# Patient Record
Sex: Female | Born: 1960 | Race: White | Hispanic: No | Smoking: Never smoker
Health system: Southern US, Community
[De-identification: ages and names within clinical notes are randomized; demographics above are authoritative.]

## PROBLEM LIST (undated history)

## (undated) DIAGNOSIS — K219 Gastro-esophageal reflux disease without esophagitis: Secondary | ICD-10-CM

## (undated) DIAGNOSIS — Z9109 Other allergy status, other than to drugs and biological substances: Secondary | ICD-10-CM

## (undated) DIAGNOSIS — J45909 Unspecified asthma, uncomplicated: Secondary | ICD-10-CM

## (undated) DIAGNOSIS — I10 Essential (primary) hypertension: Secondary | ICD-10-CM

## (undated) DIAGNOSIS — J40 Bronchitis, not specified as acute or chronic: Secondary | ICD-10-CM

## (undated) DIAGNOSIS — M810 Age-related osteoporosis without current pathological fracture: Secondary | ICD-10-CM

## (undated) DIAGNOSIS — F418 Other specified anxiety disorders: Secondary | ICD-10-CM

## (undated) DIAGNOSIS — F419 Anxiety disorder, unspecified: Secondary | ICD-10-CM

## (undated) DIAGNOSIS — Z9289 Personal history of other medical treatment: Secondary | ICD-10-CM

## (undated) DIAGNOSIS — Z8669 Personal history of other diseases of the nervous system and sense organs: Secondary | ICD-10-CM

## (undated) DIAGNOSIS — J302 Other seasonal allergic rhinitis: Secondary | ICD-10-CM

## (undated) HISTORY — DX: Personal history of other diseases of the nervous system and sense organs: Z86.69

## (undated) HISTORY — PX: ABDOMINAL HYSTERECTOMY: SHX81

## (undated) HISTORY — DX: Gastro-esophageal reflux disease without esophagitis: K21.9

## (undated) HISTORY — PX: TOTAL HIP ARTHROPLASTY: SHX124

## (undated) HISTORY — DX: Other specified anxiety disorders: F41.8

## (undated) HISTORY — DX: Personal history of other medical treatment: Z92.89

## (undated) HISTORY — PX: ROTATOR CUFF REPAIR: SHX139

## (undated) HISTORY — DX: Unspecified asthma, uncomplicated: J45.909

## (undated) HISTORY — DX: Other allergy status, other than to drugs and biological substances: Z91.09

## (undated) HISTORY — PX: WRIST SURGERY: SHX841

---

## 1998-03-02 ENCOUNTER — Other Ambulatory Visit: Admission: RE | Admit: 1998-03-02 | Discharge: 1998-03-02 | Payer: Self-pay | Admitting: Obstetrics and Gynecology

## 1999-04-08 ENCOUNTER — Other Ambulatory Visit: Admission: RE | Admit: 1999-04-08 | Discharge: 1999-04-08 | Payer: Self-pay | Admitting: Obstetrics and Gynecology

## 1999-07-04 ENCOUNTER — Inpatient Hospital Stay (HOSPITAL_COMMUNITY): Admission: RE | Admit: 1999-07-04 | Discharge: 1999-07-05 | Payer: Self-pay | Admitting: Obstetrics and Gynecology

## 2000-05-01 ENCOUNTER — Other Ambulatory Visit: Admission: RE | Admit: 2000-05-01 | Discharge: 2000-05-01 | Payer: Self-pay | Admitting: Obstetrics and Gynecology

## 2001-05-14 ENCOUNTER — Other Ambulatory Visit: Admission: RE | Admit: 2001-05-14 | Discharge: 2001-05-14 | Payer: Self-pay | Admitting: Obstetrics and Gynecology

## 2002-06-28 ENCOUNTER — Other Ambulatory Visit: Admission: RE | Admit: 2002-06-28 | Discharge: 2002-06-28 | Payer: Self-pay | Admitting: Obstetrics and Gynecology

## 2003-07-10 ENCOUNTER — Other Ambulatory Visit: Admission: RE | Admit: 2003-07-10 | Discharge: 2003-07-10 | Payer: Self-pay | Admitting: Obstetrics and Gynecology

## 2003-09-30 ENCOUNTER — Inpatient Hospital Stay (HOSPITAL_COMMUNITY): Admission: EM | Admit: 2003-09-30 | Discharge: 2003-10-01 | Payer: Self-pay | Admitting: Emergency Medicine

## 2004-01-30 ENCOUNTER — Ambulatory Visit (HOSPITAL_COMMUNITY): Admission: RE | Admit: 2004-01-30 | Discharge: 2004-01-31 | Payer: Self-pay | Admitting: Orthopedic Surgery

## 2004-09-24 ENCOUNTER — Other Ambulatory Visit: Admission: RE | Admit: 2004-09-24 | Discharge: 2004-09-24 | Payer: Self-pay | Admitting: Obstetrics and Gynecology

## 2005-10-27 ENCOUNTER — Other Ambulatory Visit: Admission: RE | Admit: 2005-10-27 | Discharge: 2005-10-27 | Payer: Self-pay | Admitting: Obstetrics and Gynecology

## 2007-03-09 ENCOUNTER — Ambulatory Visit: Payer: Self-pay | Admitting: Internal Medicine

## 2008-05-08 ENCOUNTER — Encounter: Admission: RE | Admit: 2008-05-08 | Discharge: 2008-05-08 | Payer: Self-pay | Admitting: Internal Medicine

## 2008-11-04 ENCOUNTER — Encounter: Admission: RE | Admit: 2008-11-04 | Discharge: 2008-11-04 | Payer: Self-pay | Admitting: Orthopedic Surgery

## 2010-08-16 ENCOUNTER — Encounter
Admission: RE | Admit: 2010-08-16 | Discharge: 2010-08-16 | Payer: Self-pay | Source: Home / Self Care | Attending: Allergy | Admitting: Allergy

## 2010-10-02 ENCOUNTER — Other Ambulatory Visit: Payer: Self-pay | Admitting: Obstetrics and Gynecology

## 2010-10-04 ENCOUNTER — Other Ambulatory Visit: Payer: Self-pay | Admitting: Obstetrics and Gynecology

## 2010-10-04 DIAGNOSIS — N6459 Other signs and symptoms in breast: Secondary | ICD-10-CM

## 2010-11-25 ENCOUNTER — Ambulatory Visit
Admission: RE | Admit: 2010-11-25 | Discharge: 2010-11-25 | Disposition: A | Discharge status: Home / Self Care | Payer: Self-pay | Source: Ambulatory Visit | Attending: Obstetrics and Gynecology | Admitting: Obstetrics and Gynecology

## 2010-11-25 DIAGNOSIS — N6459 Other signs and symptoms in breast: Secondary | ICD-10-CM

## 2011-01-21 NOTE — Assessment & Plan Note (Signed)
[facility]                           PRIMARY CARE OFFICE NOTE   MENDE, BISWELL                      MRN:          [id number]  DATE:03/09/2007                            DOB:          Jun 10, 1961    REFERRING PHYSICIAN:  Guy Sandifer. Henderson Cloud, M.D.   CHIEF COMPLAINT:  New patient to practice/high blood pressure.   HISTORY OF PRESENT ILLNESS:  Patient is a 50 year old white female here  to establish primary care.  She has seen her gynecologist, and he has  noted several elevated blood pressure readings within the last 3-6  months.  At home, she notes systolic blood pressure of a high 140s and  diastolic high 90s to low 100s.  She occasionally has associated  headache, however unclear whether her headache is from elevated blood  pressure versus history of chronic migraines, for which she sees Dr.  Meryl Crutch.   Review of her recent history notes chronic NSAIDs use for neck pain.  She underwent evaluation by a physician at [facility].  An MRI was performed.  She was recently prescribed a Medrol  Dosepak which has somewhat improved her symptoms; however, she notes a  chronic history of taking Advil for her headaches, and for the last two  months, she has been taking at least 600 mg of ibuprofen daily.  She  also takes an allergy medication with a decongestant every 12 hours.   She did have preeclampsia with her first child, but the blood pressure  stabilized two years after pregnancy.  She was on an antihypertensive in  the past.  She denies any history of heart disease or a stroke.  She  does note a family history of hypertension in both her father and  mother.  No family history of premature coronary disease.  She has had  several orthopedic procedures in the past.  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  She has also had  right  rotator cuff repair and also right wrist surgery, and a hysterectomy in  1996.  She also has anxiety issues.  Due to increased stress and the  death of her mother, she was started on Xanax ER and has been on  benzodiazepines for the last 10 years.   PAST MEDICAL HISTORY:  1. History of migraine headache.  2. Gastroesophageal reflux disease.  3. History of preeclampsia.  4. 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  5. Right wrist surgery in 2005.  6. Right rotator cuff repair in 2005.  7. Status post hysterectomy in 1996.  8. Chronic allergic rhinitis.   CURRENT MEDICATIONS:  1. Allergy/D caplets q.12h.  2. Xanax ER 1 mg once daily.  3. Wellbutrin XL 300 mg once daily.  4. Ambien 10 mg at bedtime.  5. Topamax 100 mg at bedtime.  6. Flexeril 10 mg at bedtime.   ALLERGIES TO MEDICATIONS:  PENICILLIN, which causes a rash.  Also,  DEMEROL, MORPHINE, and CODEINE cause severe pruritus.  She notes an  intolerance to TETRACYCLINE and SEPTRA.   SOCIAL HISTORY:  Patient is married for the last 15 years.  She has one  teenaged son who is 35 years old who accompanies her today.   FAMILY HISTORY:  Father is alive at age 33.  History of colon cancer,  status post surgery and chemo.  Hypertensive.  Mother deceased at age 85  due to complications of rare lung disease. She was hypertensive and had  migraine headaches.   HABITS:  Occasional alcohol.  No history of tobacco use or recreational  drug use.   PREVENTATIVE CARE HISTORY:  Last Pap was in 2008.  Last colonoscopy was  four years ago with Dr. Matthias Hughs.   REVIEW OF SYSTEMS:  No current HEENT symptoms.  Her migraine headaches  have been relatively quiescent.  Denies any chest pain or dyspnea.  No  current heartburn, nausea, vomiting, constipation, diarrhea.  All other  systems are negative.   PHYSICAL EXAMINATION:  VITAL SIGNS:  Height is 5 feet 4.  Weight is  138.8 pounds.  Temperature is 97.  Pulse is 102.  BP is  148/105 in the  left arm in a seated position.  GENERAL:  The patient is a pleasant, well-developed and well-nourished  50 year old white female who appears her stated age.  HEENT:  Normocephalic, atraumatic.  Pupils are equal and reactive to  light bilaterally.  Extraocular motility was intact.  Patient was  anicteric.  Conjunctivae were within normal limits.  External auditory  canals and tympanic membranes are clear bilaterally.  Oropharyngeal exam  is unremarkable.  NECK:  Supple without any evidence of adenopathy, carotid bruits, or  thyromegaly.  LUNGS:  Normal respiratory effort.  Chest is clear to auscultation  bilaterally.  No rales, rhonchi or wheezing.  CARDIOVASCULAR:  Regular rate and rhythm.  No significant murmurs, rubs  or gallops appreciated.  ABDOMEN:  Soft and nontender.  Positive bowel sounds.  No organomegaly.  MUSCULOSKELETAL:  Patient had a healed scar of the right shoulder.  NEUROLOGIC:  Cranial nerves II-XII are grossly intact.  She was  nonfocal.   IMPRESSION:  1. Hypertension, likely exacerbated by nonsteroidals use and      decongestants.  2. Migraine headache.  3. Gastroesophageal reflux disease.  4. History of generalized anxiety disorder, on chronic      benzodiazepines.  5. History of left hip replacement.  6. Health maintenance.   RECOMMENDATIONS:  1. Patient will be started on Inderal LA 80 mg once daily.  2. She is to decrease her nonsteroidal use and also taper off      decongestant use.  3. She was given a prescription for Xizal 5 mg p.o. nightly.  She has      had poor response to over-the-counter Claritin and Zyrtec in the      past.  4. We will arrange screening labs and baseline kidney and liver      function.  We discussed the risks of nephrotoxicity with chronic      NSAIDs use.  5. Follow up in 4-6 weeks.     Barbette Hair. Artist Pais, DO  Electronically Signed    RDY/MedQ  DD: 03/09/2007  DT: 03/09/2007  Job #: [id number]

## 2011-01-24 NOTE — Op Note (Signed)
Cynthia Gordon, Cynthia Gordon                    ACCOUNT NO.:  [id number]   MEDICAL RECORD NO.:  [id number]                   PATIENT TYPE:  INP   LOCATION:  4034                                 FACILITY:  [facility]   PHYSICIAN:  Burnard Bunting, M.D.                 DATE OF BIRTH:  [birth year]   DATE OF PROCEDURE:  09/30/2003  DATE OF DISCHARGE:  10/01/2003                                 OPERATIVE REPORT   PREOPERATIVE DIAGNOSIS:  Right distal radius fracture.   POSTOPERATIVE DIAGNOSIS:  Right distal radius fracture.   PROCEDURE:  Open reduction and internal fixation of right distal radius  fracture.   SURGEON:  Burnard Bunting, M.D.   ASSISTANT:  Jerolyn Shin. Tresa Res, M.D.   ANESTHESIA:  General endotracheal.   ESTIMATED BLOOD LOSS:  20 mL   DRAINS:  None.   TOURNIQUET TIME:  Approximately 40 minutes at 250 mmHg.   DESCRIPTION OF PROCEDURE:  The patient was brought to the operating room and  general endotracheal anesthesia was induced, preoperative antibiotics were  administered.  The right hand, wrist and arm were prepped with Duraprep  solution and draped in a sterile manner.  Collier Flowers was used to cover the  operative field. Fracture reduction was performed. The arm was then covered  with Ioban.  The arm was elevated and exsanguinated with the esmarch wrap,  tourniquet was inflated.  A volar approach to the distal radius was made  over the FCR tendon from the proximal wrist flexion crease 8 cm proximally.  The skin and subcutaneous tissue were sharply divided, bleeding points  encountered were controlled using bipolar electrocautery.  The FCR sheath  was incised on its volar surface. The FCR tendon was mobilized radially and  the sheath was again incised on its ventral surface.  Blunt dissection with  the FCR tendon and radial artery protected, the blunt dissection was  performed along the pronator quadratus. The median nerve and flexor tendons  were retracted ulnarly.  The pronator  quadratus was then detached from the  radial aspect of the radial shaft.  The fracture site was visualized. The  distal fragment was also well visualized.  A DVR volar plate was then  applied and secured in the adjustable hole with one screw. The fracture was  then reduced to the plate under fluoroscopic guidance in the AP and lateral  planes with good reduction achieved. While maintaining traction, multiple  locking screws were placed in two planes for fracture fixation.  Subarticular placement of the screws was confirmed in the AP and lateral  planes under fluoroscopy.  At this time, the proximal holes were filled with  3.5 bicortical screws.  Screw length was checked in the AP and lateral  planes under fluoroscopy.  This time the tourniquet was released, bleeding  points encountered were controlled using electrocautery. The skin was then  closed using interrupted inverted 3-0 Vicryl suture followed by simple 3-0  nylon  sutures.  A bulky dressing and volar splint were applied. The patient's hand  was then placed in a blue cradle boot.  Total tourniquet time was  approximately 40 minutes. The patient tolerated the procedure well without  immediate complications.                                               Burnard Bunting, M.D.    GSD/MEDQ  D:  10/03/2003  T:  10/03/2003  Job:  [id number]

## 2011-01-24 NOTE — H&P (Signed)
NAMEMARICEL, SWARTZENDRUBER                    ACCOUNT NO.:  [id number]   MEDICAL RECORD NO.:  [id number]                   PATIENT TYPE:  INP   LOCATION:  1610                                 FACILITY:  [facility]   PHYSICIAN:  Burnard Bunting, M.D.                 DATE OF BIRTH:  [birth year]   DATE OF ADMISSION:  09/30/2003  DATE OF DISCHARGE:                                HISTORY & PHYSICAL   CHIEF COMPLAINT:  Right wrist pain.   HISTORY OF PRESENT ILLNESS:  Cynthia Gordon is a 50 year old, left-hand  dominant female who fell on the ice on her outstretched hand on September 30, 2003.  She denies any elbow or shoulder pain.   PAST MEDICAL HISTORY:  Her past medical history is notable for:  1. Stress fracture on the left, status post total hip arthroplasty     approximately five years ago at [facility].  2. __________ grafting approximately ten years ago.  3. Hysterectomy in 1996.  4. Bladder surgery in 1998 and 2000.   DRUG ALLERGIES:  1. PENICILLIN.  2. MORPHINE.  3. ERYTHROMYCIN.  4. TETRACYCLINE.  5. CODEINE.   Give itching.  She has had no anaphylactic-type reactions.   CURRENT MEDICATIONS:  Zoloft, Xanax, Clarinex, Fosamax, vitamins, Trazodone,  and Topamax.   PHYSICAL EXAMINATION:  GENERAL APPEARANCE:  The patient is in mild distress.  CHEST:  Clear to auscultation.  HEART:  The heartbeat is regular.  ABDOMEN:  Benign.  EXTREMITIES:  The right wrist demonstrated mild deformity and swelling.  Sensation is intact in the medial radial and ulnar distribution.  She has  good EPL and interosseous function, although strength is somewhat diminished  due to pain.   LABORATORY DATA:  X-rays show intra-articular dorsally displaced fracture.   IMPRESSION:  Right distal radius fracture.   PLAN:  Open reduction and internal fixation.  The risks and benefits were  discussed.  The primary risks including, but not limited to infection, nerve  and vessel damage, nonunion,  malunion, and need for more surgery, are  discussed with the patient.  The patient and her husband understand.  All  questions answered.                                               Burnard Bunting, M.D.    GSD/MEDQ  D:  10/01/2003  T:  10/01/2003  Job:  [id number]

## 2011-01-24 NOTE — Op Note (Signed)
NAME:  Cynthia Gordon, Cynthia Gordon                         ACCOUNT NO.:  [id number]   MEDICAL RECORD NO.:  [id number]                   PATIENT TYPE:  OIB   LOCATION:  4733                                 FACILITY:  [facility]   PHYSICIAN:  Burnard Bunting, M.D.                 DATE OF BIRTH:  08/06/1961   DATE OF PROCEDURE:  DATE OF DISCHARGE:  01/31/2004                                 OPERATIVE REPORT   PREOPERATIVE DIAGNOSIS:  Right shoulder rotator cuff tear and labral tear.   POSTOPERATIVE DIAGNOSIS:  Right shoulder rotator cuff tear and labral tear.   OPERATION PERFORMED:  Right shoulder diagnostic arthroscopy with debridement  of labral tear, open biceps tenodesis and open rotator cuff repair.   SURGEON:  Burnard Bunting, M.D.   ASSISTANT:  Jerolyn Shin. Lavender, M.D.   ESTIMATED BLOOD LOSS:  25 mL.   DRAINS:  None.   EXAMINATION UNDER ANESTHESIA:  Range of motion:  External rotation in 15  degrees, abduction 70 degrees, external rotation 90 degrees, abduction 90,  internal rotation 90 degrees, abduction 60.  Patient has about 175 degrees  forward flexion.  She has 1+ anterior posterior instability with less than 1  cm sulcus signs.   OPERATIVE FINDINGS:  Diagnostic and operative arthroscopy.  1. Type 2 SLAP tear.  2. No Hill-Sachs lesions or bony Bankart lesion.  3. Intact articular cartilage.  4. 95% thickness rotator cuff tear at the leading edge of the supraspinatus.  5. Stretching of the labral tissue off the anterior inferior glenohumeral     ligament.   DESCRIPTION OF PROCEDURE:  The patient was brought to the operating room  where general endotracheal anesthesia was induced.  Preoperative IV  antibiotics were administered.  Patient was positioned in beach chair  positioner with the head in neutral position.  Right shoulder, arm and hand  were prepped in DuraPrep solution and draped in sterile manner.  Topographic  anatomy of the shoulder was identified including the anterior,  posterior and  lateral margins of the acromion as well as the coracoid process.  The  subacromial space was injected with 100,000 to 200,000 mL of epinephrine and  saline.  The shoulder joint was injected with 20 mL of saline.  The portal  for the scope was placed 2 cm  medial and inferior to the posterolateral  margin of the acromion.  The anterior portal was created under direct  visualization with a spinal needle.  Diagnostic and operative arthroscopy  was performed. Type 2 SLAP was identified.  The biceps anchor was unstable.  The anterior inferior glenohumeral ligament appeared stretched but was  structurally intact.  There was no Hill-Sachs lesion, rotator cuff was noted  to have 95% thickness tear at the leading edge of the supraspinatus.  This  was visualized with both the bursal and articular side.  At this time the  biceps tendon was released.  Labrum was debrided.  Very early synovitis was  noted on the rotator cuff, particularly the infraspinatus.  At this time the  subacromial decompression and bursectomy was performed.  This made the 95%  thickness rotator cuff tear quite visible.  Following release but not  resection of the coracoacromial ligament through a lateral portal, the  instruments were removed.  The anterior and posterior portals were closed  using 3-0 nylon suture.  Collier Flowers was used to cover the operative field.  A 3  cm incision was then made off the anterolateral margin of the acromion.  Skin and subcutaneous tissue were sharply divided. Deltoid was split for a  measured distance of 3 cm.  Rotator cuff tear was identified.  Through this  incision, biceps tenodesis was performed with bioabsorbable screw with a 7 x  22 mm bioabsorbable interference screw and a #2 FiberWire suture.  Rotator  cuff was repaired using one 5.0 bioabsorbable corkscrew anchor.  The  watertight repair was achieved after a thorough prepping of the greater  tuberosity.  The shoulder was then  irrigated.  Deltoid split was closed  using a #1 Vicryl suture.  The skin was closed using interrupted inverted 3-  0 Prolene.  The patient was then placed in a bulky dressing and shoulder  immobilizer.  The patient tolerated the procedure well without immediate  complications.                                               Burnard Bunting, M.D.    GSD/MEDQ  D:  02/11/2004  T:  02/12/2004  Job:  [id number]

## 2011-10-30 ENCOUNTER — Other Ambulatory Visit: Payer: Self-pay | Admitting: Obstetrics and Gynecology

## 2011-10-30 DIAGNOSIS — Z1231 Encounter for screening mammogram for malignant neoplasm of breast: Secondary | ICD-10-CM

## 2011-11-27 ENCOUNTER — Ambulatory Visit: Payer: Self-pay

## 2011-12-05 ENCOUNTER — Ambulatory Visit: Payer: Self-pay

## 2011-12-23 ENCOUNTER — Ambulatory Visit
Admission: RE | Admit: 2011-12-23 | Discharge: 2011-12-23 | Disposition: A | Discharge status: Home / Self Care | Payer: Federal, State, Local not specified - PPO | Source: Ambulatory Visit | Attending: Obstetrics and Gynecology | Admitting: Obstetrics and Gynecology

## 2011-12-23 DIAGNOSIS — Z1231 Encounter for screening mammogram for malignant neoplasm of breast: Secondary | ICD-10-CM

## 2012-03-06 ENCOUNTER — Ambulatory Visit (INDEPENDENT_AMBULATORY_CARE_PROVIDER_SITE_OTHER): Payer: Federal, State, Local not specified - PPO | Admitting: Physician Assistant

## 2012-03-06 VITALS — BP 130/87 | HR 86 | Temp 98.7°F | Resp 16 | Ht 63.5 in | Wt 151.8 lb

## 2012-03-06 DIAGNOSIS — R059 Cough, unspecified: Secondary | ICD-10-CM

## 2012-03-06 DIAGNOSIS — J329 Chronic sinusitis, unspecified: Secondary | ICD-10-CM

## 2012-03-06 DIAGNOSIS — R05 Cough: Secondary | ICD-10-CM

## 2012-03-06 MED ORDER — PREDNISONE 20 MG PO TABS: ORAL_TABLET | ORAL | Status: AC

## 2012-03-06 MED ORDER — AZITHROMYCIN 250 MG PO TABS: ORAL_TABLET | ORAL | Status: AC

## 2012-03-06 MED ORDER — HYDROCODONE-HOMATROPINE 5-1.5 MG/5ML PO SYRP: 5.0000 mL | ORAL_SOLUTION | Freq: Three times a day (TID) | ORAL | Status: AC | PRN

## 2012-03-06 NOTE — Progress Notes (Signed)
  Subjective:    Patient ID: Cynthia Gordon, female    DOB: 1960-11-25, 51 y.o.   MRN: [id number]  HPI Patient presents with 7 day history of nasal congestion, cough, sinus pressure, and ear fullness. Denies fever, chills, nausea, vomiting, or diarrhea. She takes Claritin and Singulair daily for seasonal allergies. Also has been taking Mucinex which helps slightly.    Admits to increased stress at home. Husband recently diagnosed with Alzheimer's. 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        Review of Systems  Constitutional: Negative for fever and chills.  HENT: Positive for congestion, sore throat, rhinorrhea, postnasal drip and sinus pressure. Negative for ear pain.   Eyes: Negative for discharge and itching.  Respiratory: Positive for cough. Negative for wheezing.   Gastrointestinal: Negative for nausea, vomiting and diarrhea.  Skin: Negative for rash.  Neurological: Negative for dizziness and headaches.       Objective:   Physical Exam  Constitutional: She is oriented to person, place, and time. She appears well-developed and well-nourished.  HENT:  Head: Normocephalic and atraumatic.  Right Ear: Hearing, tympanic membrane, external ear and ear canal normal.  Left Ear: Hearing, tympanic membrane, external ear and ear canal normal.  Mouth/Throat: Uvula is midline, oropharynx is clear and moist and mucous membranes are normal. No oropharyngeal exudate.       Slight tonsillar erythema bilaterally. No exudate or tonsillar swelling  Eyes: Conjunctivae are normal.  Neck: Normal range of motion. Neck supple.  Cardiovascular: Normal rate, regular rhythm and normal heart sounds.   Pulmonary/Chest: Effort normal and breath sounds normal.  Lymphadenopathy:    She has no cervical adenopathy.  Neurological: She is alert and oriented to person, place, and time.  Psychiatric: She has a normal mood and affect. Her behavior is normal. Judgment and thought content normal.           Assessment & Plan:   1. Sinusitis  Continue Claritin and Mucinex daily  Use Astepro nasal spray as directed azithromycin (ZITHROMAX) 250 MG tablet, predniSONE (DELTASONE) 20 MG tablet  2. Cough  Per patient she tolerates hydrocodone but not codeine.  HYDROcodone-homatropine (HYCODAN) 5-1.5 MG/5ML syrup

## 2012-03-23 ENCOUNTER — Ambulatory Visit (INDEPENDENT_AMBULATORY_CARE_PROVIDER_SITE_OTHER): Payer: Federal, State, Local not specified - PPO | Admitting: Physician Assistant

## 2012-03-23 VITALS — BP 128/78 | HR 94 | Temp 98.6°F | Resp 17 | Ht 63.0 in | Wt 152.0 lb

## 2012-03-23 DIAGNOSIS — J309 Allergic rhinitis, unspecified: Secondary | ICD-10-CM

## 2012-03-23 DIAGNOSIS — J029 Acute pharyngitis, unspecified: Secondary | ICD-10-CM

## 2012-03-23 DIAGNOSIS — J329 Chronic sinusitis, unspecified: Secondary | ICD-10-CM

## 2012-03-23 LAB — POCT RAPID STREP A (OFFICE): Rapid Strep A Screen: NEGATIVE

## 2012-03-23 MED ORDER — IPRATROPIUM BROMIDE 0.03 % NA SOLN: 2.0000 | Freq: Two times a day (BID) | NASAL | Status: DC

## 2012-03-23 MED ORDER — LEVOFLOXACIN 500 MG PO TABS: 500.0000 mg | ORAL_TABLET | Freq: Every day | ORAL | Status: AC

## 2012-03-23 NOTE — Progress Notes (Signed)
  Subjective:    Patient ID: Cynthia Gordon, female    DOB: 08/07/1961, 51 y.o.   MRN: [id number]  HPI 51 year old female presents with congestion, bilateral otalgia, and sore throat. Was seen on 6/29 and given a Z-pack, hycodan, and prednisone. Says these helped but then last week her symptoms returned.  Complains of ear pressure and fullness as well as sore throat and nasal congestion. Currently takes Claritin and Singulair daily as well as uses Nasocort.  She has an allergist who she has seen in the past but has never been on allergy injections.   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  Admits to increased fatigue.     Review of Systems  All other systems reviewed and are negative.       Objective:   Physical Exam  Constitutional: She is oriented to person, place, and time. She appears well-developed and well-nourished.  HENT:  Head: Normocephalic and atraumatic.  Right Ear: Hearing, external ear and ear canal normal. A middle ear effusion is present.  Left Ear: Hearing, external ear and ear canal normal. A middle ear effusion is present.  Mouth/Throat: Uvula is midline and mucous membranes are normal. No oropharyngeal exudate (+tonsilar erythema).  Eyes: Conjunctivae are normal.  Neck: Normal range of motion. Neck supple.  Cardiovascular: Normal rate, regular rhythm and normal heart sounds.   Pulmonary/Chest: Effort normal and breath sounds normal.  Lymphadenopathy:    She has no cervical adenopathy.  Neurological: She is alert and oriented to person, place, and time.  Psychiatric: She has a normal mood and affect. Her behavior is normal. Judgment and thought content normal.          Assessment & Plan:   1. Acute pharyngitis  POCT rapid strep A  2. Sinusitis  levofloxacin (LEVAQUIN) 500 MG tablet  3. Allergic rhinitis  ipratropium (ATROVENT) 0.03 % nasal spray  If no improvement of symptoms after  this course of antibiotics, recommend follow up with either her allergist or refer to ENT.  Recommend follow up with PCP to discuss options for adjusting anti-depressant's while dealing with this time of stress.

## 2012-04-02 ENCOUNTER — Other Ambulatory Visit: Payer: Self-pay | Admitting: Allergy

## 2012-04-02 ENCOUNTER — Ambulatory Visit
Admission: RE | Admit: 2012-04-02 | Discharge: 2012-04-02 | Disposition: A | Discharge status: Home / Self Care | Payer: Federal, State, Local not specified - PPO | Source: Ambulatory Visit | Attending: Allergy | Admitting: Allergy

## 2012-04-02 DIAGNOSIS — R059 Cough, unspecified: Secondary | ICD-10-CM

## 2012-04-02 DIAGNOSIS — R05 Cough: Secondary | ICD-10-CM

## 2012-11-26 ENCOUNTER — Other Ambulatory Visit: Payer: Self-pay

## 2012-11-26 DIAGNOSIS — Z1231 Encounter for screening mammogram for malignant neoplasm of breast: Secondary | ICD-10-CM

## 2012-12-24 ENCOUNTER — Ambulatory Visit
Admission: RE | Admit: 2012-12-24 | Discharge: 2012-12-24 | Disposition: A | Discharge status: Home / Self Care | Payer: Federal, State, Local not specified - PPO | Source: Ambulatory Visit

## 2012-12-24 DIAGNOSIS — Z1231 Encounter for screening mammogram for malignant neoplasm of breast: Secondary | ICD-10-CM

## 2013-02-28 ENCOUNTER — Emergency Department (HOSPITAL_COMMUNITY): Payer: Federal, State, Local not specified - PPO

## 2013-02-28 ENCOUNTER — Encounter (HOSPITAL_COMMUNITY): Payer: Self-pay | Admitting: Emergency Medicine

## 2013-02-28 ENCOUNTER — Emergency Department (HOSPITAL_COMMUNITY)
Admission: EM | Admit: 2013-02-28 | Discharge: 2013-03-01 | Disposition: A | Discharge status: Home / Self Care | Payer: Federal, State, Local not specified - PPO | Attending: Emergency Medicine | Admitting: Emergency Medicine

## 2013-02-28 DIAGNOSIS — Z79899 Other long term (current) drug therapy: Secondary | ICD-10-CM | POA: Insufficient documentation

## 2013-02-28 DIAGNOSIS — R079 Chest pain, unspecified: Secondary | ICD-10-CM

## 2013-02-28 DIAGNOSIS — I10 Essential (primary) hypertension: Secondary | ICD-10-CM | POA: Insufficient documentation

## 2013-02-28 HISTORY — DX: Essential (primary) hypertension: I10

## 2013-02-28 LAB — BASIC METABOLIC PANEL
BUN: 20 mg/dL (ref 6–23)
CO2: 29 mEq/L (ref 19–32)
Calcium: 9.9 mg/dL (ref 8.4–10.5)
Chloride: 98 mEq/L (ref 96–112)
Creatinine, Ser: 1.07 mg/dL (ref 0.50–1.10)
GFR calc Af Amer: 68 mL/min — ABNORMAL LOW (ref >90)
GFR calc non Af Amer: 59 mL/min — ABNORMAL LOW (ref >90)
Glucose, Bld: 108 mg/dL — ABNORMAL HIGH (ref 70–99)
Potassium: 3.3 mEq/L — ABNORMAL LOW (ref 3.5–5.1)
Sodium: 137 mEq/L (ref 135–145)

## 2013-02-28 LAB — POCT I-STAT TROPONIN I: Troponin i, poc: 0 ng/mL (ref 0.00–0.08)

## 2013-02-28 LAB — CBC
HCT: 40.3 % (ref 36.0–46.0)
Hemoglobin: 13.7 g/dL (ref 12.0–15.0)
MCH: 30 pg (ref 26.0–34.0)
MCHC: 34 g/dL (ref 30.0–36.0)
MCV: 88.2 fL (ref 78.0–100.0)
Platelets: 312 10*3/uL (ref 150–400)
RBC: 4.57 MIL/uL (ref 3.87–5.11)
RDW: 13.3 % (ref 11.5–15.5)
WBC: 6.9 10*3/uL (ref 4.0–10.5)

## 2013-02-28 LAB — PRO B NATRIURETIC PEPTIDE: Pro B Natriuretic peptide (BNP): 22.2 pg/mL (ref 0–125)

## 2013-02-28 NOTE — ED Provider Notes (Signed)
History    CSN: [id number] Arrival date & time 02/28/13  2157  First MD Initiated Contact with Patient 02/28/13 2352     Chief Complaint  Patient presents with  . Chest Pain   (Consider location/radiation/quality/duration/timing/severity/associated sxs/prior Treatment) HPI Cynthia Gordon is a 52 yo woman with hypertension and GERD.  She presents with complaints of chest pain.   Pain began sometime yesterday. The patient notes that, the day prior, she did not have any food intake because she was preparing a party for her husband. She had several alcoholic drinks that night and attributed discomfort, initially, to acid reflux. Her PCP called a prescription in for Sucralfate and this medication seemed to help, temporarily. But, pain is back.  Pain has been intermittent but present most of the time. She describes it has aching, centrally located. No excacerbating or relieving factors. Pain is non-radiating. She says "just the idea of eating makes me sick". But, she denies nausea and vomiting. No change in bowel movements.  Sx feel like previous GERD sx but says her sx have never been this severe. She has no history of pancreatitis or GB disease. No h/o abdominal surgeries.   Only RF for CAD is HTN. No history of cardiac evaluation.   Current pain is 6/10. Most severe 9/10. Patient notes that BP was 168/96 when paramedics checked it at her home. She said this came down with NTG. She feels like ASA, which she took after speaking with 911 dispatcher, seemed to help her CP somewhat.   Past Medical History  Diagnosis Date  . Hypertension    Past Surgical History  Procedure Laterality Date  . Abdominal hysterectomy    . Total hip arthroplasty    . Rotator cuff repair     No family history on file. History  Substance Use Topics  . Smoking status: Never Smoker   . Smokeless tobacco: Not on file  . Alcohol Use: Yes   OB History   Grav Para Term Preterm Abortions TAB SAB Ect Mult Living               SH: drinks occasionally, no drugs or tobacco use. Paralegal on FMLA. Husband, a general dentist, diagnosed with Alzheimer's at age of 52 yo, 18 months ago. Had to close practice, wife is caregiver.    Review of Systems Gen: no weight loss, fevers, chills, night sweats Eyes: no discharge or drainage, no occular pain or visual changes Nose: no epistaxis or rhinorrhea Mouth: no dental pain, no sore throat Neck: no neck pain Lungs: no SOB, cough, wheezing CV: As per history of present illness, otherwise negative Abd: As per history of present illness, otherwise negative GU: no dysuria or gross hematuria MSK: no myalgias or arthralgias Neuro: no headache, no focal neurologic deficits Skin: no rash Psych- patient notes that she has been under a tremendous amount of psychological stress for the past year.  Allergies  Amoxicillin; Codeine; Demerol; Morphine and related; Septra; and Tetracyclines & related  Home Medications   Current Outpatient Rx  Name  Route  Sig  Dispense  Refill  . buPROPion (WELLBUTRIN XL) 150 MG 24 hr tablet   Oral   Take 150 mg by mouth daily.         . DULoxetine (CYMBALTA) 30 MG capsule   Oral   Take 30 mg by mouth daily.         Marland Kitchen ipratropium (ATROVENT) 0.03 % nasal spray   Nasal   Place 2 sprays  into the nose 2 (two) times daily.   30 mL   5   . ketoprofen (ORUDIS) 75 MG capsule   Oral   Take 75 mg by mouth 4 (four) times daily as needed.         . loratadine (CLARITIN) 10 MG tablet   Oral   Take 10 mg by mouth daily.         . montelukast (SINGULAIR) 10 MG tablet   Oral   Take 10 mg by mouth at bedtime.         . pantoprazole (PROTONIX) 20 MG tablet   Oral   Take 20 mg by mouth daily.         . SUMAtriptan (IMITREX) 100 MG tablet   Oral   Take 100 mg by mouth every 2 (two) hours as needed.         . topiramate (TOPAMAX) 100 MG tablet   Oral   Take 100 mg by mouth 2 (two) times daily.         .  valsartan-hydrochlorothiazide (DIOVAN-HCT) 160-25 MG per tablet   Oral   Take 1 tablet by mouth daily.         Marland Kitchen zolpidem (AMBIEN) 5 MG tablet   Oral   Take 2.5 mg by mouth at bedtime as needed.          BP 122/71  Pulse 89  Temp(Src) 98.1 F (36.7 C) (Oral)  Resp 18  SpO2 97% Physical Exam Gen: well developed and well nourished appearing Head: NCAT Eyes: PERL, EOMI Nose: no epistaixis or rhinorrhea Mouth/throat: mucosa is moist and pink Neck: supple, no stridor Lungs: CTA B, no wheezing, rhonchi or rales Heart-Regular rate and rhythm, 2/6 systolic murmur, extremities well perfused Abd: soft, notender, nondistended Back: no ttp, no cva ttp Skin: no rashese, wnl Neuro: CN ii-xii grossly intact, no focal deficits Psyche; normal affect,  calm and cooperative.   2/6 systolic murmur  ED Course  Procedures (including critical care time)  Dg Chest 2 View  02/28/2013   *RADIOLOGY REPORT*  Clinical Data: Chest pain.  CHEST - 2 VIEW  Comparison: 04/02/2012  Findings: Heart and mediastinal contours are within normal limits. No focal opacities or effusions.  No acute bony abnormality.  IMPRESSION: No active cardiopulmonary disease.   Original Report Authenticated By: Charlett Nose, M.D.   No diagnosis found.  EKG: nsr, no acute ischemic changes, normal intervals, normal axis, normal qrs complex  Results for orders placed during the hospital encounter of 02/28/13 (from the past 24 hour(s))  CBC     Status: None   Collection Time    02/28/13 10:14 PM      Result Value Range   WBC 6.9  4.0 - 10.5 K/uL   RBC 4.57  3.87 - 5.11 MIL/uL   Hemoglobin 13.7  12.0 - 15.0 g/dL   HCT 16.1  09.6 - 04.5 %   MCV 88.2  78.0 - 100.0 fL   MCH 30.0  26.0 - 34.0 pg   MCHC 34.0  30.0 - 36.0 g/dL   RDW 40.9  81.1 - 91.4 %   Platelets 312  150 - 400 K/uL  BASIC METABOLIC PANEL     Status: Abnormal   Collection Time    02/28/13 10:14 PM      Result Value Range   Sodium 137  135 - 145 mEq/L    Potassium 3.3 (*) 3.5 - 5.1 mEq/L   Chloride 98  96 - 112  mEq/L   CO2 29  19 - 32 mEq/L   Glucose, Bld 108 (*) 70 - 99 mg/dL   BUN 20  6 - 23 mg/dL   Creatinine, Ser 1.61  0.50 - 1.10 mg/dL   Calcium 9.9  8.4 - 09.6 mg/dL   GFR calc non Af Amer 59 (*) >90 mL/min   GFR calc Af Amer 68 (*) >90 mL/min  PRO B NATRIURETIC PEPTIDE     Status: None   Collection Time    02/28/13 10:14 PM      Result Value Range   Pro B Natriuretic peptide (BNP) 22.2  0 - 125 pg/mL  POCT I-STAT TROPONIN I     Status: None   Collection Time    02/28/13 10:18 PM      Result Value Range   Troponin i, poc 0.00  0.00 - 0.08 ng/mL   Comment 3              MDM  DDX: gastritis, GERD, pancreatitis, ACS, pna, ptx, pleural or pericardial effusion, pericarditis  Patient is pain free. CXR, EKG, CBC, CMP, lipase and troponin are all wnl after approximately 36h of chest pain. Pain resolved after tx with GI cocktail. Patient has a close relationship with her PCP and will call at 0830 to discuss her symptoms and my recommendations for outpatient cardiac stress in the next 1 to 2 days. She agrees to return to the ED for recurrent chest pain.   Brandt Loosen, MD 03/01/13 [phone]

## 2013-02-28 NOTE — ED Notes (Signed)
PT. ARRIVED WITH EMS FROM HOME REPORTS MID CHEST PAIN WITH SLIGHT SOB AND OCCASIONAL DRY COUGH AND SLIGHT NAUSEA ONSET YESTERDAY , PT. RECEIVED 4 BABY ASA AND 1 NTG SL PTA WITH RELIEF , RATES PAIN 0/10 AT ARRIVAL.

## 2013-02-28 NOTE — ED Notes (Signed)
Per [facility], pt c/o non radiating mid-sternum chest discomfort x1 day, increase discomfort today, pt states her acid reflux medication is not helping her as it usually does. Pt reports taking ASA 324 mg PTA and given Nitro x1 en route, ECG ST, 22 g LAC. VSS BP 118/64, 98% 2L, HR 110,

## 2013-02-28 NOTE — ED Notes (Signed)
MD Manly at bedside. 

## 2013-03-01 LAB — HEPATIC FUNCTION PANEL
ALT: 23 U/L (ref 0–35)
AST: 20 U/L (ref 0–37)
Albumin: 4.6 g/dL (ref 3.5–5.2)
Alkaline Phosphatase: 83 U/L (ref 39–117)
Bilirubin, Direct: <0.1 mg/dL (ref 0.0–0.3)
Total Bilirubin: 0.2 mg/dL — ABNORMAL LOW (ref 0.3–1.2)
Total Protein: 7.8 g/dL (ref 6.0–8.3)

## 2013-03-01 LAB — LIPASE, BLOOD: Lipase: 44 U/L (ref 11–59)

## 2013-03-01 MED ORDER — POTASSIUM CHLORIDE CRYS ER 20 MEQ PO TBCR
20.0000 meq | EXTENDED_RELEASE_TABLET | Freq: Once | ORAL | Status: AC
  Administered 2013-03-01: 20 meq via ORAL
  Filled 2013-03-01: qty 1

## 2013-03-01 MED ORDER — GI COCKTAIL ~~LOC~~
30.0000 mL | Freq: Once | ORAL | Status: AC
  Administered 2013-03-01: 30 mL via ORAL
  Filled 2013-03-01: qty 30

## 2013-03-03 ENCOUNTER — Ambulatory Visit (INDEPENDENT_AMBULATORY_CARE_PROVIDER_SITE_OTHER): Payer: Federal, State, Local not specified - PPO | Admitting: Internal Medicine

## 2013-03-03 ENCOUNTER — Encounter (HOSPITAL_COMMUNITY): Payer: Self-pay | Admitting: Cardiology

## 2013-03-03 ENCOUNTER — Encounter: Payer: Self-pay | Admitting: Cardiology

## 2013-03-03 VITALS — BP 112/72 | HR 97 | Ht 63.5 in | Wt 155.8 lb

## 2013-03-03 DIAGNOSIS — R079 Chest pain, unspecified: Secondary | ICD-10-CM

## 2013-03-03 DIAGNOSIS — I10 Essential (primary) hypertension: Secondary | ICD-10-CM

## 2013-03-03 NOTE — Progress Notes (Signed)
03/03/2013   PCP: Londell Moh, MD   Chief Complaint  Patient presents with  . New Evaluation    referred by Dr. Renne Crigler for evaluation of chest pain. breath feels labored and chest is sore deep inside.    Primary Cardiologist: Dr. Rennis Golden  HPI:  52 year old married white female presents today at the request of her primary care office secondary to episodic chest pain at rest.  She has a history of hypertension that began when she was in her 90s resolved or was controlled and with her pregnancy she was preeclamptic and has been treated for hypertension since that time. She is previously been on Diovan HCTZ but in February of this year she was having bilateral arm tingling they were going to send her to cardiologist at that time but they increased her blood pressure medication adding amlodipine 5 mg daily and arm tingling resolved.    She had been doing quite well bill under a lot of stress her husband has been diagnosed with premature Alzheimer's disease. Because of that he closed his practice as a dentist they downsized their home and she has been dealing with life changing issues for the last year.  This weekend she gave her husband a birthday party that was catered and with a party planner see she did no physical stress but developed midsternal chest pressure that radiated through to her back no radiation to her arms or neck. She does have a history of GERD and thought should not eating much that day and did have alcoholic beverages she stopped the alcoholic beverages took dexilant that she is on chronically, the discomfort never resolved. She would sleep in a sitting position. The next day discomfort continued and by Monday it was much increased they called Dr. Octaviano Glow office who ordered Carafate which she took but she continued with the chest pressure that seemed to increase and she felt more weak and tingly and somewhat lightheaded.  EMS was called her blood pressure on arrival was  elevated at 168/96.  She was taken to the emergency room her potassium was 3.3, troponins were negative, EKG with sinus rhythm but no acute changes. Two-view chest x-ray was stable cardiopulmonary appearance with no new focal or acute abnormality suggested.  Her pain actually went away by the time EMS arrived after taking 4 baby aspirin.  In the emergency room her pain began to return and that she was given a GI cocktail the pain never completely resolved for 2-3 hours so unsure if the GI cocktail helped at all.  Since that time she has felt no pain. Symptoms associated with chest pressure or a lack of appetite mild nausea no diaphoresis no specific shortness of breath but it is that she was having to labor a little bit to breathe.    Other history does include a hysterectomy but her ovaries remain. Has a history of migraines and allergies that she uses inhalers and takes Singulair for as followed by Dr. Redmond Callas.  No family history of coronary artery disease.  She was seen by primary care yesterday d-dimer was drawn and she was told it was normal.  Allergies  Allergen Reactions  . Amoxicillin Rash  . Codeine Rash  . Demerol (Meperidine) Rash  . Morphine And Related Rash  . Septra (Sulfamethoxazole W-Trimethoprim) Rash  . Tetracyclines & Related Rash    Current Outpatient Prescriptions  Medication Sig Dispense Refill  . amLODipine (NORVASC) 5 MG tablet Take 5 mg by mouth daily.      Marland Kitchen  buPROPion (WELLBUTRIN XL) 150 MG 24 hr tablet Take 300 mg by mouth daily.       Marland Kitchen co-enzyme Q-10 30 MG capsule Take 30 mg by mouth daily.      Marland Kitchen dexlansoprazole (DEXILANT) 60 MG capsule Take 60 mg by mouth daily.      . DULoxetine (CYMBALTA) 30 MG capsule Take 30 mg by mouth daily.      Marland Kitchen ipratropium (ATROVENT) 0.03 % nasal spray Place 2 sprays into the Gordon 2 (two) times daily.  30 mL  5  . ketoprofen (ORUDIS) 75 MG capsule Take 75 mg by mouth 4 (four) times daily as needed for pain.       Marland Kitchen loratadine  (CLARITIN) 10 MG tablet Take 10 mg by mouth daily.      . montelukast (SINGULAIR) 10 MG tablet Take 10 mg by mouth at bedtime.      . Potassium Gluconate 595 MG CAPS Take 595 capsules by mouth daily.      . sucralfate (CARAFATE) 1 G tablet Take 1 g by mouth 4 (four) times daily.      . SUMAtriptan (IMITREX) 100 MG tablet Take 100 mg by mouth every 2 (two) hours as needed for migraine.       . topiramate (TOPAMAX) 100 MG tablet Take 100 mg by mouth daily.       . valsartan-hydrochlorothiazide (DIOVAN-HCT) 320-25 MG per tablet Take 1 tablet by mouth daily.      . vitamin B-12 (CYANOCOBALAMIN) 1000 MCG tablet Take 1,000 mcg by mouth daily.      Marland Kitchen zolpidem (AMBIEN) 5 MG tablet Take 2.5 mg by mouth at bedtime as needed for sleep.        No current facility-administered medications for this visit.    Past Medical History  Diagnosis Date  . Hypertension   . Hx of migraines   . Environmental allergies     Past Surgical History  Procedure Laterality Date  . Abdominal hysterectomy    . Total hip arthroplasty    . Rotator cuff repair      ZOX:WRUEAVW:UJ colds or fevers, no weight changes Skin:no rashes or ulcers HEENT:no blurred vision, no congestion CV:see HPI PUL:see HPI GI:no diarrhea constipation or melena, no indigestion GU:no hematuria, no dysuria MS:no joint pain, no claudication Neuro:no syncope, no lightheadedness Endo:no diabetes, no thyroid disease She does not exercise due to increased stress in her life at this point.  PHYSICAL EXAM BP 112/72  Pulse 97  Ht 5' 3.5" (1.613 m)  Wt 155 lb 12.8 oz (70.67 kg)  BMI 27.16 kg/m2 General:Pleasant affect, NAD Skin:Warm and dry, brisk capillary refill HEENT:normocephalic, sclera clear, mucus membranes moist Neck:supple, no JVD, no bruits  Heart:S1S2 with split S2 RRR without murmur, gallup, rub or click Lungs:clear without rales, rhonchi, or wheezes WJX:BJYN, non tender, + BS, do not palpate liver spleen or masses Ext:no  lower ext edema, 2+ pedal pulses, 2+ radial pulses Neuro:alert and oriented, MAE, follows commands, + facial symmetry Please note in the emergency room they documented a murmur I did not hear a murmur today.  EKG: Sinus rhythm without acute EKG changes her rate 97 and no changes from EKG from the emergency room on June 23.  ASSESSMENT AND PLAN Chest pain at rest Began this past weekend, relief with 4 baby asa.  Returned in ER GI cocktail did not relieve-in that it took 2-3 hours after the GI cocktail for pain to resolve.  Plan Cynthia Gordon with her history  of hypertension and recent significant pain.  HTN (hypertension) Began when she was in her 54's.  Recently 3rd BP med added.    We have asked her to take 81 mg aspirin daily, sooner indigestion increases and she should stop it. She'll follow with Dr. Rennis Golden for the results of the stress test.  If further problems occur she should call us.  _________________________________________________________________________________________________________________________________________________________________________________________ Pt. Seen and examined. Agree with the NP/PA-C note as written.  Chest pain has features concerning for angina without marked relief by altering her GI Medications.  I agree with stress testing and will see her back in the office to discuss the results.  Cynthia Nose, MD, [facility] Attending Cardiologist [facility]

## 2013-03-03 NOTE — Assessment & Plan Note (Signed)
Began when she was in her 52's.  Recently 3rd BP med added.

## 2013-03-03 NOTE — Assessment & Plan Note (Addendum)
Began this past weekend, relief with 4 baby asa.  Returned in ER GI cocktail did not relieve-in that it took 2-3 hours after the GI cocktail for pain to resolve.  Plan Steffanie Dunn with her history of hypertension and recent significant pain.

## 2013-03-03 NOTE — Patient Instructions (Addendum)
We will schedule you for a stress test that you do not walk but with medicaton.  Do not eat or drink after midnight the night before the test. See below.  Add Asprin 81 mg daily , if you develop indigestion then stop the Asprin.  Your physician has requested that you have a lexiscan myoview. For further information please visit [email or web address] Please follow instruction sheet, as given.  Follow up with Dr. Rennis Golden for results.

## 2013-03-08 DIAGNOSIS — Z9289 Personal history of other medical treatment: Secondary | ICD-10-CM

## 2013-03-08 HISTORY — DX: Personal history of other medical treatment: Z92.89

## 2013-03-10 ENCOUNTER — Ambulatory Visit (HOSPITAL_COMMUNITY)
Admission: RE | Admit: 2013-03-10 | Discharge: 2013-03-10 | Disposition: A | Discharge status: Home / Self Care | Payer: Federal, State, Local not specified - PPO | Source: Ambulatory Visit | Attending: Cardiovascular Disease | Admitting: Cardiovascular Disease

## 2013-03-10 DIAGNOSIS — E663 Overweight: Secondary | ICD-10-CM | POA: Insufficient documentation

## 2013-03-10 DIAGNOSIS — R079 Chest pain, unspecified: Secondary | ICD-10-CM | POA: Insufficient documentation

## 2013-03-10 DIAGNOSIS — R0989 Other specified symptoms and signs involving the circulatory and respiratory systems: Secondary | ICD-10-CM | POA: Insufficient documentation

## 2013-03-10 DIAGNOSIS — R42 Dizziness and giddiness: Secondary | ICD-10-CM | POA: Insufficient documentation

## 2013-03-10 DIAGNOSIS — I10 Essential (primary) hypertension: Secondary | ICD-10-CM | POA: Insufficient documentation

## 2013-03-10 DIAGNOSIS — R0609 Other forms of dyspnea: Secondary | ICD-10-CM | POA: Insufficient documentation

## 2013-03-10 DIAGNOSIS — R5383 Other fatigue: Secondary | ICD-10-CM | POA: Insufficient documentation

## 2013-03-10 DIAGNOSIS — R5381 Other malaise: Secondary | ICD-10-CM | POA: Insufficient documentation

## 2013-03-10 MED ORDER — REGADENOSON 0.4 MG/5ML IV SOLN
0.4000 mg | Freq: Once | INTRAVENOUS | Status: AC
  Administered 2013-03-10: 0.4 mg via INTRAVENOUS

## 2013-03-10 MED ORDER — TECHNETIUM TC 99M SESTAMIBI GENERIC - CARDIOLITE
10.1000 | Freq: Once | INTRAVENOUS | Status: AC | PRN
  Administered 2013-03-10: 10.1 via INTRAVENOUS

## 2013-03-10 MED ORDER — TECHNETIUM TC 99M SESTAMIBI GENERIC - CARDIOLITE
30.7000 | Freq: Once | INTRAVENOUS | Status: AC | PRN
  Administered 2013-03-10: 30.7 via INTRAVENOUS

## 2013-03-10 MED ORDER — AMINOPHYLLINE 25 MG/ML IV SOLN
75.0000 mg | Freq: Once | INTRAVENOUS | Status: AC
  Administered 2013-03-10: 75 mg via INTRAVENOUS

## 2013-03-10 NOTE — Procedures (Addendum)
[facility] [address] [place] [address] [phone]  Cardiology Nuclear Med Study  [name] is a 52 y.o. female     MRN : [id number]     DOB: 04-08-1961  Procedure Date: 03/10/2013  Nuclear Med Background Indication for Stress Test:  Evaluation for Ischemia History:  NO PRIOR HISTORY REPORTED Cardiac Risk Factors: Hypertension, Lipids and Overweight  Symptoms:  Chest Pain, Dizziness, DOE, Fatigue, Light-Headedness and SOB   Nuclear Pre-Procedure Caffeine/Decaff Intake:  10:00pm NPO After: 8:00am   IV Site: R Forearm  IV 0.9% NS with Angio Cath:  22g  Chest Size (in):  N/A IV Started by: Emmit Pomfret, RN  Height: 5' 3.5" (1.613 m)  Cup Size: DD  BMI:  Body mass index is 27.02 kg/(m^2). Weight:  155 lb (70.308 kg)   Tech Comments:  N/A    Nuclear Med Study 1 or 2 day study: 1 day  Stress Test Type:  Lexiscan  Order Authorizing Provider:  Zoila Shutter, MD   Resting Radionuclide: Technetium 47m Sestamibi  Resting Radionuclide Dose: 10.1 mCi   Stress Radionuclide:  Technetium 36m Sestamibi  Stress Radionuclide Dose: 30.7 mCi           Stress Protocol Rest HR: 83 Stress HR: 126  Rest BP: 128/92 Stress BP: 139/89  Exercise Time (min): n/a METS: n/a          Dose of Adenosine (mg):  n/a Dose of Lexiscan: 0.4 mg  Dose of Atropine (mg): n/a Dose of Dobutamine: n/a mcg/kg/min (at max HR)  Stress Test Technologist: Ernestene Mention, CCT Nuclear Technologist: Gonzella Lex, CNMT   Rest Procedure:  Myocardial perfusion imaging was performed at rest 45 minutes following the intravenous administration of Technetium 69m Sestamibi. Stress Procedure:  The patient received IV Lexiscan 0.4 mg over 15-seconds.  Technetium 68m Sestamibi injected at 30-seconds.  Due to patient's shortness of breath, head pain and chest tightness, she was given IV Aminophylline 75 mg. Symptoms were resolved during recovery. There were no  significant changes with Lexiscan.  Quantitative spect images were obtained after a 45 minute delay.  Transient Ischemic Dilatation (Normal <1.22):  1.10 Lung/Heart Ratio (Normal <0.45):  0.27 QGS EDV:  46 ml QGS ESV:  19 ml LV Ejection Fraction: 59%  Signed by       Rest ECG: NSR - Normal EKG  Stress ECG: No significant change from baseline ECG  QPS Raw Data Images:  Normal; no motion artifact; normal heart/lung ratio. Stress Images:  Normal homogeneous uptake in all areas of the myocardium. Rest Images:  Normal homogeneous uptake in all areas of the myocardium. Subtraction (SDS):  No evidence of ischemia.  Impression Exercise Capacity:  Lexiscan with no exercise. BP Response:  Normal blood pressure response. Clinical Symptoms:  Mild chest pain/dyspnea. ECG Impression:  No significant ST segment change suggestive of ischemia. Comparison with Prior Nuclear Study: No images to compare  Overall Impression:  Normal stress nuclear study.  LV Wall Motion:  NL LV Function; NL Wall Motion   Runell Gess, MD  03/10/2013 5:35 PM

## 2013-03-15 ENCOUNTER — Telehealth: Payer: Self-pay | Admitting: *Deleted

## 2013-03-15 NOTE — Telephone Encounter (Signed)
Called patient about normal nuc results. 03/15/13

## 2013-03-17 ENCOUNTER — Encounter: Payer: Self-pay | Admitting: *Deleted

## 2013-03-21 ENCOUNTER — Encounter: Payer: Self-pay | Admitting: Cardiovascular Disease

## 2013-03-22 ENCOUNTER — Ambulatory Visit: Payer: Federal, State, Local not specified - PPO | Admitting: Internal Medicine

## 2013-04-28 ENCOUNTER — Encounter (HOSPITAL_COMMUNITY): Payer: Self-pay | Admitting: Vascular Surgery

## 2013-04-28 ENCOUNTER — Encounter (HOSPITAL_COMMUNITY): Payer: Self-pay | Admitting: Surgery

## 2013-04-28 ENCOUNTER — Encounter (HOSPITAL_COMMUNITY)
Admission: AD | Disposition: A | Discharge status: Home / Self Care | Payer: Self-pay | Source: Ambulatory Visit | Attending: Orthopaedic Surgery

## 2013-04-28 ENCOUNTER — Ambulatory Visit (HOSPITAL_COMMUNITY): Payer: Federal, State, Local not specified - PPO | Admitting: Vascular Surgery

## 2013-04-28 ENCOUNTER — Other Ambulatory Visit (HOSPITAL_COMMUNITY): Payer: Self-pay | Admitting: Orthopaedic Surgery

## 2013-04-28 ENCOUNTER — Observation Stay (HOSPITAL_COMMUNITY)
Admission: AD | Admit: 2013-04-28 | Discharge: 2013-04-29 | Disposition: A | Discharge status: Home / Self Care | Payer: Federal, State, Local not specified - PPO | Source: Ambulatory Visit | Attending: Orthopaedic Surgery | Admitting: Orthopaedic Surgery

## 2013-04-28 DIAGNOSIS — W540XXA Bitten by dog, initial encounter: Secondary | ICD-10-CM | POA: Insufficient documentation

## 2013-04-28 DIAGNOSIS — I10 Essential (primary) hypertension: Secondary | ICD-10-CM | POA: Insufficient documentation

## 2013-04-28 DIAGNOSIS — Z79899 Other long term (current) drug therapy: Secondary | ICD-10-CM | POA: Insufficient documentation

## 2013-04-28 DIAGNOSIS — S51809A Unspecified open wound of unspecified forearm, initial encounter: Secondary | ICD-10-CM | POA: Insufficient documentation

## 2013-04-28 DIAGNOSIS — IMO0002 Reserved for concepts with insufficient information to code with codable children: Principal | ICD-10-CM

## 2013-04-28 HISTORY — PX: I & D EXTREMITY: SHX5045

## 2013-04-28 HISTORY — DX: Anxiety disorder, unspecified: F41.9

## 2013-04-28 HISTORY — DX: Age-related osteoporosis without current pathological fracture: M81.0

## 2013-04-28 HISTORY — PX: INCISION AND DRAINAGE FOREARM / WRIST DEEP: SUR675

## 2013-04-28 HISTORY — DX: Other seasonal allergic rhinitis: J30.2

## 2013-04-28 HISTORY — DX: Bronchitis, not specified as acute or chronic: J40

## 2013-04-28 LAB — CBC
HCT: 35 % — ABNORMAL LOW (ref 36.0–46.0)
Hemoglobin: 12.4 g/dL (ref 12.0–15.0)
MCH: 30.6 pg (ref 26.0–34.0)
MCHC: 35.4 g/dL (ref 30.0–36.0)
MCV: 86.4 fL (ref 78.0–100.0)
Platelets: 260 10*3/uL (ref 150–400)
RBC: 4.05 MIL/uL (ref 3.87–5.11)
RDW: 13 % (ref 11.5–15.5)
WBC: 5.3 10*3/uL (ref 4.0–10.5)

## 2013-04-28 LAB — BASIC METABOLIC PANEL
BUN: 15 mg/dL (ref 6–23)
CO2: 24 mEq/L (ref 19–32)
Calcium: 9.7 mg/dL (ref 8.4–10.5)
Chloride: 103 mEq/L (ref 96–112)
Creatinine, Ser: 0.94 mg/dL (ref 0.50–1.10)
GFR calc Af Amer: 80 mL/min — ABNORMAL LOW (ref >90)
GFR calc non Af Amer: 69 mL/min — ABNORMAL LOW (ref >90)
Glucose, Bld: 93 mg/dL (ref 70–99)
Potassium: 3.4 mEq/L — ABNORMAL LOW (ref 3.5–5.1)
Sodium: 139 mEq/L (ref 135–145)

## 2013-04-28 SURGERY — IRRIGATION AND DEBRIDEMENT EXTREMITY
Anesthesia: General | Laterality: Right | Wound class: Clean Contaminated

## 2013-04-28 MED ORDER — DIPHENHYDRAMINE HCL 50 MG/ML IJ SOLN
12.5000 mg | Freq: Once | INTRAMUSCULAR | Status: AC
  Administered 2013-04-28: 12.5 mg via INTRAVENOUS

## 2013-04-28 MED ORDER — SUCRALFATE 1 G PO TABS
1.0000 g | ORAL_TABLET | Freq: Three times a day (TID) | ORAL | Status: DC
  Administered 2013-04-28: 1 g via ORAL
  Filled 2013-04-28 (×6): qty 1

## 2013-04-28 MED ORDER — OXYCODONE HCL 5 MG PO TABS
5.0000 mg | ORAL_TABLET | ORAL | Status: DC | PRN
  Administered 2013-04-28 – 2013-04-29 (×4): 10 mg via ORAL
  Filled 2013-04-28 (×4): qty 2

## 2013-04-28 MED ORDER — BUPROPION HCL ER (XL) 300 MG PO TB24
300.0000 mg | ORAL_TABLET | Freq: Every day | ORAL | Status: DC
  Administered 2013-04-29: 300 mg via ORAL
  Filled 2013-04-28: qty 1

## 2013-04-28 MED ORDER — SODIUM CHLORIDE 0.9 % IR SOLN
Status: DC | PRN
  Administered 2013-04-28: 1

## 2013-04-28 MED ORDER — DULOXETINE HCL 30 MG PO CPEP
30.0000 mg | ORAL_CAPSULE | Freq: Every day | ORAL | Status: DC
  Administered 2013-04-29: 30 mg via ORAL
  Filled 2013-04-28: qty 1

## 2013-04-28 MED ORDER — AMLODIPINE BESYLATE 5 MG PO TABS
5.0000 mg | ORAL_TABLET | Freq: Every day | ORAL | Status: DC
  Administered 2013-04-29: 5 mg via ORAL
  Filled 2013-04-28: qty 1

## 2013-04-28 MED ORDER — POTASSIUM GLUCONATE 595 MG PO CAPS: 595.0000 | ORAL_CAPSULE | Freq: Every day | ORAL | Status: DC

## 2013-04-28 MED ORDER — COENZYME Q10 30 MG PO CAPS: 30.0000 mg | ORAL_CAPSULE | Freq: Every day | ORAL | Status: DC

## 2013-04-28 MED ORDER — IPRATROPIUM BROMIDE 0.06 % NA SOLN
2.0000 | Freq: Two times a day (BID) | NASAL | Status: DC
  Administered 2013-04-28 – 2013-04-29 (×2): 2 via NASAL
  Filled 2013-04-28: qty 30

## 2013-04-28 MED ORDER — METOCLOPRAMIDE HCL 10 MG PO TABS: 5.0000 mg | ORAL_TABLET | Freq: Three times a day (TID) | ORAL | Status: DC | PRN

## 2013-04-28 MED ORDER — DIPHENHYDRAMINE HCL 12.5 MG/5ML PO ELIX
12.5000 mg | ORAL_SOLUTION | ORAL | Status: DC | PRN
  Administered 2013-04-28 – 2013-04-29 (×4): 25 mg via ORAL
  Filled 2013-04-28 (×4): qty 10

## 2013-04-28 MED ORDER — MONTELUKAST SODIUM 10 MG PO TABS
10.0000 mg | ORAL_TABLET | Freq: Every day | ORAL | Status: DC
  Administered 2013-04-28: 10 mg via ORAL
  Filled 2013-04-28 (×2): qty 1

## 2013-04-28 MED ORDER — FENTANYL CITRATE 0.05 MG/ML IJ SOLN
INTRAMUSCULAR | Status: DC | PRN
  Administered 2013-04-28: 100 ug via INTRAVENOUS
  Administered 2013-04-28 (×2): 50 ug via INTRAVENOUS

## 2013-04-28 MED ORDER — HYDROMORPHONE HCL PF 1 MG/ML IJ SOLN
0.2500 mg | INTRAMUSCULAR | Status: DC | PRN
  Administered 2013-04-28: 0.5 mg via INTRAVENOUS

## 2013-04-28 MED ORDER — HYDROCHLOROTHIAZIDE 25 MG PO TABS
25.0000 mg | ORAL_TABLET | Freq: Every day | ORAL | Status: DC
  Administered 2013-04-29: 25 mg via ORAL
  Filled 2013-04-28: qty 1

## 2013-04-28 MED ORDER — METHOCARBAMOL 100 MG/ML IJ SOLN
500.0000 mg | Freq: Four times a day (QID) | INTRAVENOUS | Status: DC | PRN
  Filled 2013-04-28: qty 5

## 2013-04-28 MED ORDER — METHOCARBAMOL 500 MG PO TABS
500.0000 mg | ORAL_TABLET | Freq: Four times a day (QID) | ORAL | Status: DC | PRN
  Administered 2013-04-28 – 2013-04-29 (×2): 500 mg via ORAL
  Filled 2013-04-28 (×2): qty 1

## 2013-04-28 MED ORDER — SUMATRIPTAN SUCCINATE 100 MG PO TABS
100.0000 mg | ORAL_TABLET | ORAL | Status: DC | PRN
  Filled 2013-04-28: qty 1

## 2013-04-28 MED ORDER — BUPIVACAINE HCL (PF) 0.25 % IJ SOLN
INTRAMUSCULAR | Status: AC
  Filled 2013-04-28: qty 30

## 2013-04-28 MED ORDER — PIPERACILLIN-TAZOBACTAM 3.375 G IVPB
3.3750 g | Freq: Three times a day (TID) | INTRAVENOUS | Status: DC
  Administered 2013-04-28 – 2013-04-29 (×2): 3.375 g via INTRAVENOUS
  Filled 2013-04-28 (×4): qty 50

## 2013-04-28 MED ORDER — LACTATED RINGERS IV SOLN
INTRAVENOUS | Status: DC | PRN
  Administered 2013-04-28: 15:00:00 via INTRAVENOUS

## 2013-04-28 MED ORDER — TOPIRAMATE 100 MG PO TABS
100.0000 mg | ORAL_TABLET | Freq: Every day | ORAL | Status: DC
  Administered 2013-04-29: 100 mg via ORAL
  Filled 2013-04-28: qty 1

## 2013-04-28 MED ORDER — MORPHINE SULFATE 2 MG/ML IJ SOLN: 1.0000 mg | INTRAMUSCULAR | Status: DC | PRN

## 2013-04-28 MED ORDER — SODIUM CHLORIDE 0.9 % IV SOLN
INTRAVENOUS | Status: DC
  Administered 2013-04-28: 21:00:00 via INTRAVENOUS

## 2013-04-28 MED ORDER — HYDROMORPHONE HCL PF 1 MG/ML IJ SOLN
INTRAMUSCULAR | Status: AC
  Administered 2013-04-28: 0.5 mg via INTRAVENOUS
  Filled 2013-04-28: qty 1

## 2013-04-28 MED ORDER — VANCOMYCIN HCL IN DEXTROSE 1-5 GM/200ML-% IV SOLN
1000.0000 mg | INTRAVENOUS | Status: AC
  Administered 2013-04-28: 1000 mg via INTRAVENOUS

## 2013-04-28 MED ORDER — IRBESARTAN 300 MG PO TABS
300.0000 mg | ORAL_TABLET | Freq: Every day | ORAL | Status: DC
  Administered 2013-04-29: 300 mg via ORAL
  Filled 2013-04-28: qty 1

## 2013-04-28 MED ORDER — ZOLPIDEM TARTRATE 5 MG PO TABS: 5.0000 mg | ORAL_TABLET | Freq: Every evening | ORAL | Status: DC | PRN

## 2013-04-28 MED ORDER — SODIUM CHLORIDE 0.9 % IR SOLN
Status: DC | PRN
  Administered 2013-04-28: 18:00:00

## 2013-04-28 MED ORDER — ONDANSETRON HCL 4 MG/2ML IJ SOLN: 4.0000 mg | Freq: Once | INTRAMUSCULAR | Status: DC | PRN

## 2013-04-28 MED ORDER — VITAMIN B-12 1000 MCG PO TABS
1000.0000 ug | ORAL_TABLET | Freq: Every day | ORAL | Status: DC
  Filled 2013-04-28: qty 1

## 2013-04-28 MED ORDER — MIDAZOLAM HCL 5 MG/5ML IJ SOLN
INTRAMUSCULAR | Status: DC | PRN
  Administered 2013-04-28: 2 mg via INTRAVENOUS

## 2013-04-28 MED ORDER — PANTOPRAZOLE SODIUM 40 MG PO TBEC
40.0000 mg | DELAYED_RELEASE_TABLET | Freq: Every day | ORAL | Status: DC
  Administered 2013-04-29: 40 mg via ORAL
  Filled 2013-04-28: qty 1

## 2013-04-28 MED ORDER — ONDANSETRON HCL 4 MG/2ML IJ SOLN: 4.0000 mg | Freq: Four times a day (QID) | INTRAMUSCULAR | Status: DC | PRN

## 2013-04-28 MED ORDER — PROPOFOL 10 MG/ML IV BOLUS
INTRAVENOUS | Status: DC | PRN
  Administered 2013-04-28: 200 mg via INTRAVENOUS

## 2013-04-28 MED ORDER — VALSARTAN-HYDROCHLOROTHIAZIDE 320-25 MG PO TABS: 1.0000 | ORAL_TABLET | Freq: Every day | ORAL | Status: DC

## 2013-04-28 MED ORDER — METOCLOPRAMIDE HCL 5 MG/ML IJ SOLN: 5.0000 mg | Freq: Three times a day (TID) | INTRAMUSCULAR | Status: DC | PRN

## 2013-04-28 MED ORDER — LACTATED RINGERS IV SOLN
INTRAVENOUS | Status: DC
  Administered 2013-04-28: 15:00:00 via INTRAVENOUS

## 2013-04-28 MED ORDER — VANCOMYCIN HCL IN DEXTROSE 750-5 MG/150ML-% IV SOLN
750.0000 mg | Freq: Two times a day (BID) | INTRAVENOUS | Status: DC
  Administered 2013-04-29: 750 mg via INTRAVENOUS
  Filled 2013-04-28 (×2): qty 150

## 2013-04-28 MED ORDER — DIPHENHYDRAMINE HCL 50 MG/ML IJ SOLN
INTRAMUSCULAR | Status: AC
  Filled 2013-04-28: qty 1

## 2013-04-28 MED ORDER — ONDANSETRON HCL 4 MG PO TABS: 4.0000 mg | ORAL_TABLET | Freq: Four times a day (QID) | ORAL | Status: DC | PRN

## 2013-04-28 MED ORDER — LIDOCAINE HCL (CARDIAC) 20 MG/ML IV SOLN
INTRAVENOUS | Status: DC | PRN
  Administered 2013-04-28: 70 mg via INTRAVENOUS

## 2013-04-28 MED ORDER — HYDROCODONE-ACETAMINOPHEN 5-325 MG PO TABS: 1.0000 | ORAL_TABLET | ORAL | Status: DC | PRN

## 2013-04-28 SURGICAL SUPPLY — 58 items
BANDAGE CONFORM 3  STR LF (GAUZE/BANDAGES/DRESSINGS) IMPLANT
BANDAGE ELASTIC 3 VELCRO ST LF (GAUZE/BANDAGES/DRESSINGS) IMPLANT
BANDAGE ELASTIC 4 VELCRO ST LF (GAUZE/BANDAGES/DRESSINGS) ×2 IMPLANT
BLADE SURG 10 STRL SS (BLADE) ×2 IMPLANT
BNDG COHESIVE 1X5 TAN STRL LF (GAUZE/BANDAGES/DRESSINGS) IMPLANT
BNDG COHESIVE 4X5 TAN STRL (GAUZE/BANDAGES/DRESSINGS) ×2 IMPLANT
BNDG COHESIVE 4X5 WHT NS (GAUZE/BANDAGES/DRESSINGS) ×2 IMPLANT
BNDG COHESIVE 6X5 TAN STRL LF (GAUZE/BANDAGES/DRESSINGS) ×4 IMPLANT
BNDG GAUZE STRTCH 6 (GAUZE/BANDAGES/DRESSINGS) ×6 IMPLANT
CLOTH BEACON ORANGE TIMEOUT ST (SAFETY) ×2 IMPLANT
CORDS BIPOLAR (ELECTRODE) IMPLANT
COVER SURGICAL LIGHT HANDLE (MISCELLANEOUS) ×2 IMPLANT
CUFF TOURNIQUET SINGLE 18IN (TOURNIQUET CUFF) ×2 IMPLANT
CUFF TOURNIQUET SINGLE 24IN (TOURNIQUET CUFF) IMPLANT
CUFF TOURNIQUET SINGLE 34IN LL (TOURNIQUET CUFF) IMPLANT
CUFF TOURNIQUET SINGLE 44IN (TOURNIQUET CUFF) IMPLANT
DRAPE ORTHO SPLIT 77X108 STRL (DRAPES) ×2
DRAPE SURG 17X23 STRL (DRAPES) IMPLANT
DRAPE SURG ORHT 6 SPLT 77X108 (DRAPES) ×2 IMPLANT
DRAPE U-SHAPE 47X51 STRL (DRAPES) ×2 IMPLANT
DURAPREP 26ML APPLICATOR (WOUND CARE) ×2 IMPLANT
ELECT CAUTERY BLADE 6.4 (BLADE) IMPLANT
ELECT REM PT RETURN 9FT ADLT (ELECTROSURGICAL)
ELECTRODE REM PT RTRN 9FT ADLT (ELECTROSURGICAL) IMPLANT
GAUZE SPONGE 4X4 16PLY XRAY LF (GAUZE/BANDAGES/DRESSINGS) ×2 IMPLANT
GAUZE XEROFORM 1X8 LF (GAUZE/BANDAGES/DRESSINGS) ×2 IMPLANT
GLOVE BIO SURGEON STRL SZ8 (GLOVE) ×2 IMPLANT
GLOVE BIOGEL PI IND STRL 8 (GLOVE) ×2 IMPLANT
GLOVE BIOGEL PI INDICATOR 8 (GLOVE) ×2
GLOVE ORTHO TXT STRL SZ7.5 (GLOVE) ×2 IMPLANT
GOWN PREVENTION PLUS LG XLONG (DISPOSABLE) IMPLANT
GOWN PREVENTION PLUS XLARGE (GOWN DISPOSABLE) ×4 IMPLANT
GOWN STRL NON-REIN LRG LVL3 (GOWN DISPOSABLE) ×2 IMPLANT
HANDPIECE INTERPULSE COAX TIP (DISPOSABLE) ×1
KIT BASIN OR (CUSTOM PROCEDURE TRAY) ×2 IMPLANT
KIT ROOM TURNOVER OR (KITS) ×2 IMPLANT
MANIFOLD NEPTUNE II (INSTRUMENTS) ×2 IMPLANT
NS IRRIG 1000ML POUR BTL (IV SOLUTION) ×2 IMPLANT
PACK ORTHO EXTREMITY (CUSTOM PROCEDURE TRAY) ×2 IMPLANT
PAD ARMBOARD 7.5X6 YLW CONV (MISCELLANEOUS) ×4 IMPLANT
PADDING CAST ABS 4INX4YD NS (CAST SUPPLIES) ×2
PADDING CAST ABS COTTON 4X4 ST (CAST SUPPLIES) ×2 IMPLANT
PADDING CAST COTTON 6X4 STRL (CAST SUPPLIES) ×2 IMPLANT
SET HNDPC FAN SPRY TIP SCT (DISPOSABLE) ×1 IMPLANT
SPONGE GAUZE 4X4 12PLY (GAUZE/BANDAGES/DRESSINGS) ×2 IMPLANT
SPONGE LAP 18X18 X RAY DECT (DISPOSABLE) ×2 IMPLANT
STOCKINETTE IMPERVIOUS 9X36 MD (GAUZE/BANDAGES/DRESSINGS) ×2 IMPLANT
SUT ETHILON 2 0 FS 18 (SUTURE) ×6 IMPLANT
SUT ETHILON 3 0 PS 1 (SUTURE) ×4 IMPLANT
SYR CONTROL 10ML LL (SYRINGE) IMPLANT
TOWEL OR 17X24 6PK STRL BLUE (TOWEL DISPOSABLE) ×2 IMPLANT
TOWEL OR 17X26 10 PK STRL BLUE (TOWEL DISPOSABLE) ×2 IMPLANT
TUBE ANAEROBIC SPECIMEN COL (MISCELLANEOUS) ×2 IMPLANT
TUBE CONNECTING 12X1/4 (SUCTIONS) ×2 IMPLANT
TUBE FEEDING 5FR 15 INCH (TUBING) IMPLANT
UNDERPAD 30X30 INCONTINENT (UNDERPADS AND DIAPERS) ×2 IMPLANT
WATER STERILE IRR 1000ML POUR (IV SOLUTION) ×2 IMPLANT
YANKAUER SUCT BULB TIP NO VENT (SUCTIONS) ×2 IMPLANT

## 2013-04-28 NOTE — Preoperative (Signed)
Beta Blockers   Reason not to administer Beta Blockers:Not Applicable 

## 2013-04-28 NOTE — Anesthesia Preprocedure Evaluation (Signed)
Anesthesia Evaluation  Patient identified by MRN, date of birth, ID band Patient awake    Reviewed: Allergy & Precautions, H&P , NPO status , Patient's Chart, lab work & pertinent test results  Airway Mallampati: I TM Distance: >3 FB Neck ROM: full    Dental   Pulmonary asthma ,          Cardiovascular hypertension, Rhythm:regular Rate:Normal     Neuro/Psych PSYCHIATRIC DISORDERS Anxiety    GI/Hepatic GERD-  ,  Endo/Other    Renal/GU      Musculoskeletal   Abdominal   Peds  Hematology   Anesthesia Other Findings   Reproductive/Obstetrics                           Anesthesia Physical Anesthesia Plan  ASA: I  Anesthesia Plan: General   Post-op Pain Management:    Induction: Intravenous  Airway Management Planned: Oral ETT and LMA  Additional Equipment:   Intra-op Plan:   Post-operative Plan: Extubation in OR  Informed Consent: I have reviewed the patients History and Physical, chart, labs and discussed the procedure including the risks, benefits and alternatives for the proposed anesthesia with the patient or authorized representative who has indicated his/her understanding and acceptance.     Plan Discussed with: CRNA, Anesthesiologist and Surgeon  Anesthesia Plan Comments:         Anesthesia Quick Evaluation

## 2013-04-28 NOTE — H&P (Signed)
Cynthia Gordon is an 52 y.o. female.   Chief Complaint:   Right forearm pain and redness following a dog bite. HPI:   52 yo female who was bitten by her dog on her right forearm earlier this week.  She was started appropriately on oral antibiotics.  Over the past 24 hours, she started to develop worsening redness, pain, and developing purulent drainage.  She needs an urgent I&D and IV antibiotics.  She understands this fully and wishes to proceed to the OR.  Past Medical History  Diagnosis Date  . Hypertension   . Hx of migraines   . Environmental allergies   . Asthma   . GERD (gastroesophageal reflux disease)   . Depression with anxiety   . History of nuclear stress test 03/08/2013    lexiscan; normal study; EF 59%  . Bronchitis     hx of  . Anxiety   . Osteoporosis   . Seasonal allergies     Past Surgical History  Procedure Laterality Date  . Abdominal hysterectomy    . Total hip arthroplasty Left   . Rotator cuff repair Bilateral   . Wrist surgery Right     plate and screws in wrist    Family History  Problem Relation Age of Onset  . Cancer - Lung Mother   . Cancer - Colon Father   . Healthy Brother   . Cancer - Other Maternal Grandmother 80    brain cancer from melonoma  . Cancer - Other Maternal Grandfather 80    stomach cancer  . Cancer - Lung Paternal Grandfather    Social History:  reports that she has never smoked. She does not have any smokeless tobacco history on file. She reports that  drinks alcohol. She reports that she does not use illicit drugs.  Allergies:  Allergies  Allergen Reactions  . Amoxicillin Rash  . Codeine Itching  . Demerol [Meperidine] Itching  . Morphine And Related Itching  . Septra [Sulfamethoxazole W-Trimethoprim] Rash  . Tetracyclines & Related Rash    Medications Prior to Admission  Medication Sig Dispense Refill  . amLODipine (NORVASC) 5 MG tablet Take 5 mg by mouth daily.      Marland Kitchen buPROPion (WELLBUTRIN XL) 150 MG 24 hr tablet  Take 300 mg by mouth daily.       Marland Kitchen co-enzyme Q-10 30 MG capsule Take 30 mg by mouth daily.      Marland Kitchen dexlansoprazole (DEXILANT) 60 MG capsule Take 60 mg by mouth daily.      . DULoxetine (CYMBALTA) 30 MG capsule Take 30 mg by mouth daily.      Marland Kitchen ketoprofen (ORUDIS) 75 MG capsule Take 75 mg by mouth 4 (four) times daily as needed for pain.       Marland Kitchen loratadine (CLARITIN) 10 MG tablet Take 10 mg by mouth daily.      . montelukast (SINGULAIR) 10 MG tablet Take 10 mg by mouth at bedtime.      . Potassium Gluconate 595 MG CAPS Take 595 capsules by mouth daily.      . sucralfate (CARAFATE) 1 G tablet Take 1 g by mouth 4 (four) times daily.      . SUMAtriptan (IMITREX) 100 MG tablet Take 100 mg by mouth every 2 (two) hours as needed for migraine.       . topiramate (TOPAMAX) 100 MG tablet Take 100 mg by mouth daily.       . valsartan-hydrochlorothiazide (DIOVAN-HCT) 320-25 MG per tablet Take  1 tablet by mouth daily.      . vitamin B-12 (CYANOCOBALAMIN) 1000 MCG tablet Take 1,000 mcg by mouth daily.      Marland Kitchen zolpidem (AMBIEN) 5 MG tablet Take 2.5 mg by mouth at bedtime as needed for sleep.       Marland Kitchen ipratropium (ATROVENT) 0.03 % nasal spray Place 2 sprays into the nose 2 (two) times daily.  30 mL  5    Results for orders placed during the hospital encounter of 04/28/13 (from the past 48 hour(s))  CBC     Status: Abnormal   Collection Time    04/28/13  2:37 PM      Result Value Range   WBC 5.3  4.0 - 10.5 K/uL   RBC 4.05  3.87 - 5.11 MIL/uL   Hemoglobin 12.4  12.0 - 15.0 g/dL   HCT 16.1 (*) 09.6 - 04.5 %   MCV 86.4  78.0 - 100.0 fL   MCH 30.6  26.0 - 34.0 pg   MCHC 35.4  30.0 - 36.0 g/dL   RDW 40.9  81.1 - 91.4 %   Platelets 260  150 - 400 K/uL  BASIC METABOLIC PANEL     Status: Abnormal   Collection Time    04/28/13  2:37 PM      Result Value Range   Sodium 139  135 - 145 mEq/L   Potassium 3.4 (*) 3.5 - 5.1 mEq/L   Chloride 103  96 - 112 mEq/L   CO2 24  19 - 32 mEq/L   Glucose, Bld 93  70 - 99  mg/dL   BUN 15  6 - 23 mg/dL   Creatinine, Ser 7.82  0.50 - 1.10 mg/dL   Calcium 9.7  8.4 - 95.6 mg/dL   GFR calc non Af Amer 69 (*) >90 mL/min   GFR calc Af Amer 80 (*) >90 mL/min   Comment: (NOTE)     The eGFR has been calculated using the CKD EPI equation.     This calculation has not been validated in all clinical situations.     eGFR's persistently <90 mL/min signify possible Chronic Kidney     Disease.   No results found.  Review of Systems  All other systems reviewed and are negative.    Blood pressure 127/76, pulse 83, temperature 98.2 F (36.8 C), temperature source Oral, resp. rate 18, height 5' 3.5" (1.613 m), weight 68.493 kg (151 lb), SpO2 100.00%. Physical Exam  Constitutional: She is oriented to person, place, and time. She appears well-developed and well-nourished.  HENT:  Head: Normocephalic and atraumatic.  Eyes: EOM are normal. Pupils are equal, round, and reactive to light.  Neck: Normal range of motion. Neck supple.  Cardiovascular: Normal rate and regular rhythm.   Respiratory: Effort normal and breath sounds normal.  GI: Soft. Bowel sounds are normal.  Musculoskeletal:       Right forearm: She exhibits swelling and edema.       Arms: Neurological: She is alert and oriented to person, place, and time.  Skin: Skin is warm and dry.  Psychiatric: She has a normal mood and affect.     Assessment/Plan Right forearm infection with cellulitis and abscess after a dog bite 1)  To the OR for irrigation and debridement of right forearm infected dog bite wounds. 2)  Admission as an inpatient for IV antibiotics  Aneka Fagerstrom Y 04/28/2013, 5:31 PM

## 2013-04-28 NOTE — Progress Notes (Signed)
Benadryl given for itching.

## 2013-04-28 NOTE — Anesthesia Postprocedure Evaluation (Signed)
Anesthesia Post Note  Patient: Cynthia Gordon  Procedure(s) Performed: Procedure(s) (LRB): IRRIGATION AND DEBRIDEMENT RIGHT WRIST/FOREARM (Right)  Anesthesia type: general  Patient location: PACU  Post pain: Pain level controlled  Post assessment: Patient's Cardiovascular Status Stable  Last Vitals:  Filed Vitals:   04/28/13 2030  BP: 129/65  Pulse: 87  Temp: 36.9 C  Resp: 18    Post vital signs: Reviewed and stable  Level of consciousness: sedated  Complications: No apparent anesthesia complications

## 2013-04-28 NOTE — Brief Op Note (Signed)
04/28/2013  6:25 PM  PATIENT:  Cynthia Gordon  52 y.o. female  PRE-OPERATIVE DIAGNOSIS:  Infection/Cellulitis right wrist/forearm due to dog bite  POST-OPERATIVE DIAGNOSIS:  same  PROCEDURE:  Procedure(s): IRRIGATION AND DEBRIDEMENT RIGHT WRIST/FOREARM (Right)  SURGEON:  Surgeon(s) and Role:    * Kathryne Hitch, MD - Primary   ANESTHESIA:   local and general  EBL:   minimal  BLOOD ADMINISTERED:none  DRAINS: none   LOCAL MEDICATIONS USED:  MARCAINE     SPECIMEN:  No Specimen  DISPOSITION OF SPECIMEN:  N/A  COUNTS:  YES  TOURNIQUET:   Total Tourniquet Time Documented: Upper Arm (Right) - 10 minutes Total: Upper Arm (Right) - 10 minutes   DICTATION: .Other Dictation: Dictation Number [id number]  PLAN OF CARE: Admit for overnight observation  PATIENT DISPOSITION:  PACU - hemodynamically stable.   Delay start of Pharmacological VTE agent (>24hrs) due to surgical blood loss or risk of bleeding: not applicable

## 2013-04-28 NOTE — Anesthesia Procedure Notes (Signed)
Procedure Name: LMA Insertion Date/Time: 04/28/2013 4:45 PM Performed by: Gwenyth Allegra Pre-anesthesia Checklist: Patient identified, Timeout performed, Emergency Drugs available, Suction available and Patient being monitored Patient Re-evaluated:Patient Re-evaluated prior to inductionOxygen Delivery Method: Circle system utilized Preoxygenation: Pre-oxygenation with 100% oxygen Intubation Type: IV induction LMA: LMA inserted LMA Size: 4.0 Number of attempts: 1 Placement Confirmation: positive ETCO2 and breath sounds checked- equal and bilateral Tube secured with: Tape

## 2013-04-28 NOTE — Progress Notes (Signed)
ANTIBIOTIC CONSULT NOTE - INITIAL  Pharmacy Consult for Vancomycin, Zosyn Indication: right forearm infection (dog bite)  Allergies  Allergen Reactions  . Amoxicillin Rash  . Codeine Itching  . Demerol [Meperidine] Itching  . Morphine And Related Itching  . Septra [Sulfamethoxazole W-Trimethoprim] Rash  . Tetracyclines & Related Rash    Patient Measurements: Height: 5' 3.5" (161.3 cm) Weight: 151 lb (68.493 kg) IBW/kg (Calculated) : 53.55  Labs:  Recent Labs  04/28/13 1437  WBC 5.3  HGB 12.4  PLT 260  CREATININE 0.94   Estimated Creatinine Clearance: 66.6 ml/min (by C-G formula based on Cr of 0.94). No results found for this basename: VANCOTROUGH, VANCOPEAK, VANCORANDOM, GENTTROUGH, GENTPEAK, GENTRANDOM, TOBRATROUGH, TOBRAPEAK, TOBRARND, AMIKACINPEAK, AMIKACINTROU, AMIKACIN,  in the last 72 hours   Microbiology: No results found for this or any previous visit (from the past 720 hour(s)).  Medical History: Past Medical History  Diagnosis Date  . Hypertension   . Hx of migraines   . Environmental allergies   . Asthma   . GERD (gastroesophageal reflux disease)   . Depression with anxiety   . History of nuclear stress test 03/08/2013    lexiscan; normal study; EF 59%  . Bronchitis     hx of  . Anxiety   . Osteoporosis   . Seasonal allergies     Assessment: 52 year old female who was bitten by her dog earlier this week on her right forearm.  Over the 24 hours, she has started to develop worsening redness, pain, and drainage.  Taken to the OR for urgent I+D, now beginning Vancomycin and Zosyn.  Zosyn approved by physician despite rash to amoxicillin.  CrCl=82ml/min, vancomycin dose given at 5:30 pm  Goal of Therapy:  Vancomycin trough level 10-15 mcg/ml Appropriate Zosyn dosing  Plan:  1) Zosyn 3.375 grams iv Q 8 hours - 4 hr infusion 2) Vancomycin 750 mg iv Q 12 hours 3) Follow up progress, Scr, and cultures  Thank you. Okey Regal,  PharmD [phone]  04/28/2013,9:23 PM

## 2013-04-28 NOTE — Transfer of Care (Signed)
Immediate Anesthesia Transfer of Care Note  Patient: Cynthia Gordon  Procedure(s) Performed: Procedure(s): IRRIGATION AND DEBRIDEMENT RIGHT WRIST/FOREARM (Right)  Patient Location: PACU  Anesthesia Type:General  Level of Consciousness: awake, alert  and oriented  Airway & Oxygen Therapy: Patient Spontanous Breathing and Patient connected to nasal cannula oxygen  Post-op Assessment: Report given to PACU RN and Post -op Vital signs reviewed and stable  Post vital signs: Reviewed and stable  Complications: No apparent anesthesia complications

## 2013-04-29 ENCOUNTER — Encounter (HOSPITAL_COMMUNITY): Payer: Self-pay | Admitting: General Practice

## 2013-04-29 MED ORDER — TRAMADOL HCL 50 MG PO TABS: 100.0000 mg | ORAL_TABLET | Freq: Four times a day (QID) | ORAL | Status: DC | PRN

## 2013-04-29 NOTE — Op Note (Signed)
Cynthia Gordon, Cynthia Gordon               ACCOUNT NO.:  [id number]  MEDICAL RECORD NO.:  [id number]  LOCATION:  [id number]                        FACILITY:  [facility]  PHYSICIAN:  Vanita Panda. Magnus Ivan, M.D.DATE OF BIRTH:  Dec 17, 1960  DATE OF PROCEDURE:  04/28/2013 DATE OF DISCHARGE:                              OPERATIVE REPORT   PREOPERATIVE DIAGNOSIS:  Right forearm with cellulitis and small abscess status post dog bite wound.  POSTOPERATIVE DIAGNOSIS:  Right forearm with cellulitis and small abscess status post dog bite wound.  PROCEDURE:  Irrigation and debridement of right forearm soft tissue only with incision through the skin and into the fascia.  SURGEON:  Doneen Poisson, MD.  ANESTHESIA: 1. General. 2. A 0.25% plain Marcaine.  ESTIMATED BLOOD LOSS:  Minimal.  TOURNIQUET TIME:  Less than 20 minutes.  ANTIBIOTICS:  A 1 g IV vancomycin.  COMPLICATIONS:  None.  INDICATIONS:  [name] is a 52 year old female who 2 days ago was seen in our office after sustaining a dog bite to her right forearm.  She was started on oral antibiotics including clindamycin and doxycycline but over the last 24 hours started developing worsening redness in her skin, worsening pain and some purulent drainage.  I saw her in the office this afternoon.  There was only a small amount of purulent drainage.  Given the red streaking and the fact that she has an old wrist plate from a distal radius fracture and a hip replacement, this definitely warrants an open irrigation and debridement and starting IV antibiotics.  She understands this fully and does wish to proceed with surgery.  PROCEDURE DESCRIPTION:  After informed consent was obtained and appropriate right arm was marked, she was brought to the operating room and placed supine on the operating table with the right arm on arm table.  General anesthesia has been obtained.  A nonsterile tourniquet was placed on her upper right arm and the right  arm was prepped and draped with DuraPrep and sterile drapes.  A time-out was called.  She was identified as the correct patient, correct right arm.  I then lifted the arm up for just gravity purposes did not exsanguinate with an Esmarch but I had the tourniquet inflated to 250 mm pressure.  The first set of puncture wounds were on the dorsal radial side of the forearm about 1/3 the way down.  I opened this up and found minimal gross purulence and took cultures from this although she has been on antibiotics.  I then extended my incision about 2-3 inches and found edema in my side around the dorsal forearm muscles but it did not appear to have any tendon involvement.  I thoroughly irrigated this with normal saline and bacitracin solution.  I then went to the other puncture wound, it was dorsal lateral, and opened this area up as well and found the same myositis and inflamed fascia but no gross purulence.  This wound was copiously irrigated as well, so both wounds were copiously irrigated with normal saline, pulsatile lavage, and bacitracin solution. I let the tourniquet down and hemostasis was obtained.  I closed each wound with interrupted 3-0 nylon suture loosely.  I infiltrated the incisions  with 0.25% Marcaine and placed a Xeroform and well-padded sterile dressing.  She was awakened, extubated and taken to recovery room in stable condition.  All final counts were correct.  There were no complications noted.  Postoperatively, we will admit her for IV antibiotics and for observation with hopefully discharge tomorrow back on oral antibiotics.     Vanita Panda. Magnus Ivan, M.D.     CYB/MEDQ  D:  04/28/2013  T:  04/29/2013  Job:  [id number]

## 2013-04-29 NOTE — Discharge Summary (Signed)
Patient ID: Cynthia Gordon MRN: [id number] DOB/AGE: Dec 24, 1960 52 y.o.  Admit date: 04/28/2013 Discharge date: 04/29/2013  Admission Diagnoses:  Principal Problem:   Cellulitis and abscess of upper arm and forearm, right   Discharge Diagnoses:  Same  Past Medical History  Diagnosis Date  . Hypertension   . Hx of migraines   . Environmental allergies   . Asthma   . GERD (gastroesophageal reflux disease)   . Depression with anxiety   . History of nuclear stress test 03/08/2013    lexiscan; normal study; EF 59%  . Bronchitis     hx of  . Anxiety   . Osteoporosis   . Seasonal allergies     Surgeries: Procedure(s): IRRIGATION AND DEBRIDEMENT RIGHT WRIST/FOREARM on 04/28/2013   Consultants:    Discharged Condition: Improved  Hospital Course: Cynthia Gordon is an 52 y.o. female who was admitted 04/28/2013 for operative treatment ofCellulitis and abscess of upper arm and forearm. Patient has severe unremitting pain that affects sleep, daily activities, and work/hobbies. After pre-op clearance the patient was taken to the operating room on 04/28/2013 and underwent  Procedure(s): IRRIGATION AND DEBRIDEMENT RIGHT WRIST/FOREARM.    Patient was given perioperative antibiotics: Anti-infectives   Start     Dose/Rate Route Frequency Ordered Stop   04/29/13 0600  vancomycin (VANCOCIN) IVPB 1000 mg/200 mL premix     1,000 mg 200 mL/hr over 60 Minutes Intravenous On call to O.R. 04/28/13 1424 04/28/13 1740   04/29/13 0530  vancomycin (VANCOCIN) IVPB 750 mg/150 ml premix     750 mg 150 mL/hr over 60 Minutes Intravenous Every 12 hours 04/28/13 2129     04/28/13 2200  piperacillin-tazobactam (ZOSYN) IVPB 3.375 g     3.375 g 12.5 mL/hr over 240 Minutes Intravenous 3 times per day 04/28/13 2129     04/28/13 1803  polymyxin B 500,000 Units, bacitracin 50,000 Units in sodium chloride irrigation 0.9 % 500 mL irrigation  Status:  Discontinued       As needed 04/28/13 1804 04/28/13 1828        Patient was given sequential compression devices, early ambulation, and chemoprophylaxis to prevent DVT.  Patient benefited maximally from hospital stay and there were no complications.    Recent vital signs: Patient Vitals for the past 24 hrs:  BP Temp Temp src Pulse Resp SpO2 Height Weight  04/29/13 0227 95/59 mmHg 97.4 F (36.3 C) - 66 18 94 % - -  04/28/13 2149 94/56 mmHg 97.8 F (36.6 C) - 86 18 96 % - -  04/28/13 2030 129/65 mmHg 98.5 F (36.9 C) Oral 87 18 96 % - -  04/28/13 2015 - - - 91 16 94 % - -  04/28/13 2000 - - - 88 15 93 % - -  04/28/13 1945 114/69 mmHg 98.3 F (36.8 C) - 92 16 94 % - -  04/28/13 1930 112/71 mmHg - - 93 13 97 % - -  04/28/13 1915 127/74 mmHg - - 94 18 96 % - -  04/28/13 1900 127/70 mmHg - - 97 24 96 % - -  04/28/13 1845 137/76 mmHg - - 93 17 98 % - -  04/28/13 1835 141/85 mmHg 98.7 F (37.1 C) - 101 13 93 % - -  04/28/13 1443 127/76 mmHg 98.2 F (36.8 C) Oral 83 18 100 % 5' 3.5" (1.613 m) 68.493 kg (151 lb)     Recent laboratory studies:  Recent Labs  04/28/13 1437  WBC 5.3  HGB 12.4  HCT 35.0*  PLT 260  NA 139  K 3.4*  CL 103  CO2 24  BUN 15  CREATININE 0.94  GLUCOSE 93  CALCIUM 9.7     Discharge Medications:     Medication List         amLODipine 5 MG tablet  Commonly known as:  NORVASC  Take 5 mg by mouth daily.     buPROPion 150 MG 24 hr tablet  Commonly known as:  WELLBUTRIN XL  Take 300 mg by mouth daily.     co-enzyme Q-10 30 MG capsule  Take 30 mg by mouth daily.     DEXILANT 60 MG capsule  Generic drug:  dexlansoprazole  Take 60 mg by mouth daily.     DULoxetine 30 MG capsule  Commonly known as:  CYMBALTA  Take 30 mg by mouth daily.     ipratropium 0.03 % nasal spray  Commonly known as:  ATROVENT  Place 2 sprays into the nose 2 (two) times daily.     ketoprofen 75 MG capsule  Commonly known as:  ORUDIS  Take 75 mg by mouth 4 (four) times daily as needed for pain.     loratadine 10 MG tablet   Commonly known as:  CLARITIN  Take 10 mg by mouth daily.     montelukast 10 MG tablet  Commonly known as:  SINGULAIR  Take 10 mg by mouth at bedtime.     Potassium Gluconate 595 MG Caps  Take 595 mg by mouth daily.     sucralfate 1 G tablet  Commonly known as:  CARAFATE  Take 1 g by mouth 4 (four) times daily.     SUMAtriptan 100 MG tablet  Commonly known as:  IMITREX  Take 100 mg by mouth every 2 (two) hours as needed for migraine.     topiramate 100 MG tablet  Commonly known as:  TOPAMAX  Take 100 mg by mouth daily.     traMADol 50 MG tablet  Commonly known as:  ULTRAM  Take 2 tablets (100 mg total) by mouth every 6 (six) hours as needed for pain.     valsartan-hydrochlorothiazide 320-25 MG per tablet  Commonly known as:  DIOVAN-HCT  Take 1 tablet by mouth daily.     vitamin B-12 1000 MCG tablet  Commonly known as:  CYANOCOBALAMIN  Take 1,000 mcg by mouth daily.     zolpidem 5 MG tablet  Commonly known as:  AMBIEN  Take 2.5 mg by mouth at bedtime as needed for sleep.        Diagnostic Studies: No results found.  Disposition: 01-Home or Self Care      Discharge Orders   Future Orders Complete By Expires   Call MD / Call 911  As directed    Comments:     If you experience chest pain or shortness of breath, CALL 911 and be transported to the hospital emergency room.  If you develope a fever above 101 F, pus (white drainage) or increased drainage or redness at the wound, or calf pain, call your surgeon's office.   Constipation Prevention  As directed    Comments:     Drink plenty of fluids.  Prune juice may be helpful.  You may use a stool softener, such as Colace (over the counter) 100 mg twice a day.  Use MiraLax (over the counter) for constipation as needed.   Diet - low sodium heart healthy  As directed  Discharge instructions  As directed    Comments:     Increase your activities as comfort allows. Elevation and ice as needed. You can get your  current dressings wet in the shower; you can remove your current dressing in 2-3 days and get your actual incision wet. In 2-3 days, place small amount of triple antibiotic ointment on your incisions daily then band-aids. CONTINUE YOUR HOME ORAL ANTIBIOTICS   Discharge patient  As directed    Increase activity slowly as tolerated  As directed       Follow-up Information   Follow up with Kathryne Hitch, MD In 1 week.   Specialty:  Orthopedic Surgery   Contact information:   [address] [place] [address] [phone]        Signed: Kathryne Hitch 04/29/2013, 6:49 AM

## 2013-04-29 NOTE — Progress Notes (Signed)
Subjective: 1 Day Post-Op Procedure(s) (LRB): IRRIGATION AND DEBRIDEMENT RIGHT WRIST/FOREARM (Right) Patient reports pain as mild.    Objective: Vital signs in last 24 hours: Temp:  [97.4 F (36.3 C)-98.7 F (37.1 C)] 97.4 F (36.3 C) (08/22 0227) Pulse Rate:  [66-101] 66 (08/22 0227) Resp:  [13-24] 18 (08/22 0227) BP: (94-141)/(56-85) 95/59 mmHg (08/22 0227) SpO2:  [93 %-100 %] 94 % (08/22 0227) Weight:  [68.493 kg (151 lb)] 68.493 kg (151 lb) (08/21 1443)  Intake/Output from previous day: 08/21 0701 - 08/22 0700 In: 800 [I.V.:800] Out: -  Intake/Output this shift:     Recent Labs  04/28/13 1437  HGB 12.4    Recent Labs  04/28/13 1437  WBC 5.3  RBC 4.05  HCT 35.0*  PLT 260    Recent Labs  04/28/13 1437  NA 139  K 3.4*  CL 103  CO2 24  BUN 15  CREATININE 0.94  GLUCOSE 93  CALCIUM 9.7   No results found for this basename: LABPT, INR,  in the last 72 hours  Sensation intact distally Intact pulses distally Dorsiflexion/Plantar flexion intact Incision: no drainage No cellulitis present Compartment soft  Assessment/Plan: 1 Day Post-Op Procedure(s) (LRB): IRRIGATION AND DEBRIDEMENT RIGHT WRIST/FOREARM (Right) Discharge today on oral antibiotics  Cynthia Gordon Y 04/29/2013, 6:45 AM

## 2013-04-29 NOTE — Progress Notes (Signed)
UR COMPLETED  

## 2013-05-02 LAB — CULTURE, ROUTINE-ABSCESS
Culture: NO GROWTH
Gram Stain: NONE SEEN

## 2013-05-03 LAB — ANAEROBIC CULTURE

## 2013-06-10 LAB — AFB CULTURE WITH SMEAR (NOT AT ARMC): Acid Fast Smear: NONE SEEN

## 2013-07-14 ENCOUNTER — Other Ambulatory Visit: Payer: Self-pay

## 2013-07-18 ENCOUNTER — Other Ambulatory Visit: Payer: Self-pay | Admitting: Internal Medicine

## 2013-07-18 DIAGNOSIS — R161 Splenomegaly, not elsewhere classified: Secondary | ICD-10-CM

## 2013-07-20 ENCOUNTER — Ambulatory Visit
Admission: RE | Admit: 2013-07-20 | Discharge: 2013-07-20 | Disposition: A | Discharge status: Home / Self Care | Payer: Federal, State, Local not specified - PPO | Source: Ambulatory Visit | Attending: Internal Medicine | Admitting: Internal Medicine

## 2013-07-20 DIAGNOSIS — R161 Splenomegaly, not elsewhere classified: Secondary | ICD-10-CM

## 2013-09-14 ENCOUNTER — Other Ambulatory Visit: Payer: Self-pay | Admitting: Obstetrics and Gynecology

## 2013-09-14 DIAGNOSIS — N644 Mastodynia: Secondary | ICD-10-CM

## 2013-09-21 ENCOUNTER — Ambulatory Visit
Admission: RE | Admit: 2013-09-21 | Discharge: 2013-09-21 | Disposition: A | Discharge status: Home / Self Care | Payer: Federal, State, Local not specified - PPO | Source: Ambulatory Visit | Attending: Obstetrics and Gynecology | Admitting: Obstetrics and Gynecology

## 2013-09-21 DIAGNOSIS — N644 Mastodynia: Secondary | ICD-10-CM

## 2013-09-25 ENCOUNTER — Encounter: Payer: Self-pay | Admitting: Family Medicine

## 2013-09-25 ENCOUNTER — Ambulatory Visit (INDEPENDENT_AMBULATORY_CARE_PROVIDER_SITE_OTHER): Payer: Federal, State, Local not specified - PPO | Admitting: Family Medicine

## 2013-09-25 VITALS — BP 112/88 | HR 90 | Temp 98.1°F | Resp 18

## 2013-09-25 DIAGNOSIS — K529 Noninfective gastroenteritis and colitis, unspecified: Secondary | ICD-10-CM

## 2013-09-25 DIAGNOSIS — K5289 Other specified noninfective gastroenteritis and colitis: Secondary | ICD-10-CM

## 2013-09-25 MED ORDER — HYOSCYAMINE SULFATE ER 0.375 MG PO TB12: 0.3750 mg | ORAL_TABLET | Freq: Two times a day (BID) | ORAL | Status: DC | PRN

## 2013-09-25 MED ORDER — ONDANSETRON HCL 4 MG/2ML IJ SOLN
4.0000 mg | Freq: Once | INTRAMUSCULAR | Status: AC
  Administered 2013-09-25: 4 mg via INTRAMUSCULAR

## 2013-09-25 MED ORDER — PROMETHAZINE HCL 12.5 MG PO TABS: 12.5000 mg | ORAL_TABLET | Freq: Four times a day (QID) | ORAL | Status: DC | PRN

## 2013-09-25 MED ORDER — ONDANSETRON 4 MG PO TBDP: 8.0000 mg | ORAL_TABLET | Freq: Once | ORAL | Status: AC

## 2013-09-25 NOTE — Patient Instructions (Signed)
Viral Gastroenteritis Viral gastroenteritis is also known as stomach flu. This condition affects the stomach and intestinal tract. It can cause sudden diarrhea and vomiting. The illness typically lasts 3 to 8 days. Most people develop an immune response that eventually gets rid of the virus. While this natural response develops, the virus can make you quite ill. CAUSES  Many different viruses can cause gastroenteritis, such as rotavirus or noroviruses. You can catch one of these viruses by consuming contaminated food or water. You may also catch a virus by sharing utensils or other personal items with an infected person or by touching a contaminated surface. SYMPTOMS  The most common symptoms are diarrhea and vomiting. These problems can cause a severe loss of body fluids (dehydration) and a body salt (electrolyte) imbalance. Other symptoms may include:  Fever.  Headache.  Fatigue.  Abdominal pain. DIAGNOSIS  Your caregiver can usually diagnose viral gastroenteritis based on your symptoms and a physical exam. A stool sample may also be taken to test for the presence of viruses or other infections. TREATMENT  This illness typically goes away on its own. Treatments are aimed at rehydration. The most serious cases of viral gastroenteritis involve vomiting so severely that you are not able to keep fluids down. In these cases, fluids must be given through an intravenous line (IV). HOME CARE INSTRUCTIONS   Drink enough fluids to keep your urine clear or pale yellow. Drink small amounts of fluids frequently and increase the amounts as tolerated.  Ask your caregiver for specific rehydration instructions.  Avoid:  Foods high in sugar.  Alcohol.  Carbonated drinks.  Tobacco.  Juice.  Caffeine drinks.  Extremely hot or cold fluids.  Fatty, greasy foods.  Too much intake of anything at one time.  Dairy products until 24 to 48 hours after diarrhea stops.  You may consume probiotics.  Probiotics are active cultures of beneficial bacteria. They may lessen the amount and number of diarrheal stools in adults. Probiotics can be found in yogurt with active cultures and in supplements.  Wash your hands well to avoid spreading the virus.  Only take over-the-counter or prescription medicines for pain, discomfort, or fever as directed by your caregiver. Do not give aspirin to children. Antidiarrheal medicines are not recommended.  Ask your caregiver if you should continue to take your regular prescribed and over-the-counter medicines.  Keep all follow-up appointments as directed by your caregiver. SEEK IMMEDIATE MEDICAL CARE IF:   You are unable to keep fluids down.  You do not urinate at least once every 6 to 8 hours.  You develop shortness of breath.  You notice blood in your stool or vomit. This may look like coffee grounds.  You have abdominal pain that increases or is concentrated in one small area (localized).  You have persistent vomiting or diarrhea.  You have a fever.  The patient is a child younger than 3 months, and he or she has a fever.  The patient is a child older than 3 months, and he or she has a fever and persistent symptoms.  The patient is a child older than 3 months, and he or she has a fever and symptoms suddenly get worse.  The patient is a baby, and he or she has no tears when crying. MAKE SURE YOU:   Understand these instructions.  Will watch your condition.  Will get help right away if you are not doing well or get worse. Document Released: 08/25/2005 Document Revised: 11/17/2011 Document Reviewed: 06/11/2011   [employer] Patient Information 2014 [employer]  

## 2013-09-25 NOTE — Progress Notes (Signed)
Subjective:    Patient ID: Cynthia Gordon, female    DOB: 02-22-1961, 53 y.o.   MRN: [id number]  HPI This chart was scribed for Nilda Simmer, MD by Andrew Au, ED Scribe. This patient was seen in room 12 and the patient's care was started at 4:06 PM.  HPI Comments: Cynthia Gordon is a 53 y.o. female who presents to the [facility] complaining of watery emesis onset less than 1 hour ago. The emesis had no mucous, blood or bile.  Pt was at the facility to bring her 51 y.o. son to be seen for the same illness and she began throwing up.  She has had 1 episodes while being at the facility. She denies diarrhea but she states she is beginning to get an upset stomach and describes her stomach pain as a knot. She has an associate HA and chills. She denies fever and sweats. She has h/o migraines and HTN. She states that she has no other children.  No recent travel, camping, antibiotic use.  Son currently sick with same illness.  Past Medical History  Diagnosis Date  . Hypertension   . Hx of migraines   . Environmental allergies   . Asthma   . GERD (gastroesophageal reflux disease)   . Depression with anxiety   . History of nuclear stress test 03/08/2013    lexiscan; normal study; EF 59%  . Bronchitis     hx of  . Anxiety   . Osteoporosis   . Seasonal allergies    Allergies  Allergen Reactions  . Amoxicillin Rash  . Codeine Itching  . Demerol [Meperidine] Itching  . Morphine And Related Itching  . Septra [Sulfamethoxazole-Trimethoprim] Rash  . Tetracyclines & Related Rash   Prior to Admission medications   Medication Sig Start Date End Date Taking? Authorizing Provider  amLODipine (NORVASC) 5 MG tablet Take 5 mg by mouth daily.    Historical Provider, MD  buPROPion (WELLBUTRIN XL) 150 MG 24 hr tablet Take 300 mg by mouth daily.     Historical Provider, MD  co-enzyme Q-10 30 MG capsule Take 30 mg by mouth daily.    Historical Provider, MD  dexlansoprazole (DEXILANT)  60 MG capsule Take 60 mg by mouth daily.    Historical Provider, MD  DULoxetine (CYMBALTA) 30 MG capsule Take 30 mg by mouth daily.    Historical Provider, MD  ipratropium (ATROVENT) 0.03 % nasal spray Place 2 sprays into the nose 2 (two) times daily. 03/23/12 03/23/13  Nelva Nay, PA-C  ketoprofen (ORUDIS) 75 MG capsule Take 75 mg by mouth 4 (four) times daily as needed for pain.     Historical Provider, MD  loratadine (CLARITIN) 10 MG tablet Take 10 mg by mouth daily.    Historical Provider, MD  montelukast (SINGULAIR) 10 MG tablet Take 10 mg by mouth at bedtime.    Historical Provider, MD  Potassium Gluconate 595 MG CAPS Take 595 mg by mouth daily.    Historical Provider, MD  sucralfate (CARAFATE) 1 G tablet Take 1 g by mouth 4 (four) times daily.    Historical Provider, MD  SUMAtriptan (IMITREX) 100 MG tablet Take 100 mg by mouth every 2 (two) hours as needed for migraine.     Historical Provider, MD  topiramate (TOPAMAX) 100 MG tablet Take 100 mg by mouth daily.     Historical Provider, MD  traMADol (ULTRAM) 50 MG tablet Take 2 tablets (100 mg total) by mouth every 6 (  six) hours as needed for pain. 04/29/13   Kathryne Hitch, MD  valsartan-hydrochlorothiazide (DIOVAN-HCT) 320-25 MG per tablet Take 1 tablet by mouth daily.    Historical Provider, MD  vitamin B-12 (CYANOCOBALAMIN) 1000 MCG tablet Take 1,000 mcg by mouth daily.    Historical Provider, MD  zolpidem (AMBIEN) 5 MG tablet Take 2.5 mg by mouth at bedtime as needed for sleep.     Historical Provider, MD    Review of Systems  Constitutional: Positive for chills. Negative for fever and diaphoresis.  Gastrointestinal: Positive for nausea, vomiting and abdominal pain. Negative for diarrhea and abdominal distention.  Neurological: Positive for headaches.       Objective:   Physical Exam  Nursing note and vitals reviewed. Constitutional: She is oriented to person, place, and time. She appears well-developed and  well-nourished. No distress.  HENT:  Head: Normocephalic and atraumatic.  Mouth/Throat: Oropharynx is clear and moist.  Eyes: Conjunctivae and EOM are normal. Pupils are equal, round, and reactive to light.  Neck: Neck supple. No tracheal deviation present.  Cardiovascular: Normal rate, regular rhythm and normal heart sounds.   No murmur heard. Pulmonary/Chest: Effort normal. No respiratory distress.  Abdominal: Soft. Bowel sounds are normal. She exhibits no distension and no mass. There is tenderness ( mild) in the right lower quadrant. There is no rebound and no guarding.  Musculoskeletal: Normal range of motion.  Neurological: She is alert and oriented to person, place, and time.  Skin: Skin is warm and dry.  Psychiatric: She has a normal mood and affect. Her behavior is normal.   Filed Vitals:   09/25/13 1159  BP: 112/88  Pulse: 90  Temp: 98.1 F (36.7 C)  TempSrc: Oral  Resp: 18  SpO2: 100%    Meds ordered this encounter  Medications  . ondansetron (ZOFRAN-ODT) disintegrating tablet 8 mg    Sig:   . ondansetron (ZOFRAN) injection 4 mg    Sig:   . promethazine (PHENERGAN) 12.5 MG tablet    Sig: Take 1 tablet (12.5 mg total) by mouth every 6 (six) hours as needed for nausea or vomiting.    Dispense:  20 tablet    Refill:  0  . hyoscyamine (LEVBID) 0.375 MG 12 hr tablet    Sig: Take 1 tablet (0.375 mg total) by mouth every 12 (twelve) hours as needed.    Dispense:  30 tablet    Refill:  0   Zofran 4mg  IM administered in office. (pt did not tolerate Zofran 8mg  ODT and discarded).    Assessment & Plan:  . 1. Gastroenteritis    1. Gastroenteritis:  New.  S/p Zofran 4mg  IM in office; rx for Phenergan 25mg  po provided. BRAT diet, aggressive frequent hydration at home.  RTC inability to keep down fluids.    I personally performed the services described in this documentation, which was scribed in my presence.  The recorded information has been reviewed and is  accurate.  Nilda Simmer, M.D.  [facility] [address] [place], [place]  [address] [phone] phone [phone] fax

## 2013-10-06 ENCOUNTER — Other Ambulatory Visit: Payer: Self-pay | Admitting: Orthopedic Surgery

## 2013-10-06 DIAGNOSIS — M25552 Pain in left hip: Secondary | ICD-10-CM

## 2013-10-10 ENCOUNTER — Ambulatory Visit
Admission: RE | Admit: 2013-10-10 | Discharge: 2013-10-10 | Disposition: A | Discharge status: Home / Self Care | Payer: Federal, State, Local not specified - PPO | Source: Ambulatory Visit | Attending: Orthopedic Surgery | Admitting: Orthopedic Surgery

## 2013-10-10 DIAGNOSIS — M25552 Pain in left hip: Secondary | ICD-10-CM

## 2013-11-22 ENCOUNTER — Other Ambulatory Visit: Payer: Self-pay

## 2013-11-22 DIAGNOSIS — Z1231 Encounter for screening mammogram for malignant neoplasm of breast: Secondary | ICD-10-CM

## 2013-12-26 ENCOUNTER — Ambulatory Visit
Admission: RE | Admit: 2013-12-26 | Discharge: 2013-12-26 | Disposition: A | Discharge status: Home / Self Care | Payer: Federal, State, Local not specified - PPO | Source: Ambulatory Visit

## 2013-12-26 DIAGNOSIS — Z1231 Encounter for screening mammogram for malignant neoplasm of breast: Secondary | ICD-10-CM

## 2014-12-08 ENCOUNTER — Other Ambulatory Visit: Payer: Self-pay

## 2014-12-08 DIAGNOSIS — Z1231 Encounter for screening mammogram for malignant neoplasm of breast: Secondary | ICD-10-CM

## 2014-12-29 ENCOUNTER — Ambulatory Visit: Payer: Federal, State, Local not specified - PPO

## 2015-02-28 ENCOUNTER — Other Ambulatory Visit: Payer: Self-pay | Admitting: Orthopedic Surgery

## 2015-02-28 DIAGNOSIS — M545 Low back pain: Secondary | ICD-10-CM

## 2015-03-01 ENCOUNTER — Ambulatory Visit
Admission: RE | Admit: 2015-03-01 | Discharge: 2015-03-01 | Disposition: A | Discharge status: Home / Self Care | Payer: Federal, State, Local not specified - PPO | Source: Ambulatory Visit

## 2015-03-01 DIAGNOSIS — Z1231 Encounter for screening mammogram for malignant neoplasm of breast: Secondary | ICD-10-CM

## 2015-03-09 ENCOUNTER — Ambulatory Visit
Admission: RE | Admit: 2015-03-09 | Discharge: 2015-03-09 | Disposition: A | Discharge status: Home / Self Care | Payer: Federal, State, Local not specified - PPO | Source: Ambulatory Visit | Attending: Orthopedic Surgery | Admitting: Orthopedic Surgery

## 2015-03-09 DIAGNOSIS — M545 Low back pain: Secondary | ICD-10-CM

## 2015-03-13 ENCOUNTER — Other Ambulatory Visit: Payer: Self-pay | Admitting: Orthopedic Surgery

## 2015-03-13 ENCOUNTER — Ambulatory Visit
Admission: RE | Admit: 2015-03-13 | Discharge: 2015-03-13 | Disposition: A | Discharge status: Home / Self Care | Payer: Federal, State, Local not specified - PPO | Source: Ambulatory Visit | Attending: Orthopedic Surgery | Admitting: Orthopedic Surgery

## 2015-03-13 DIAGNOSIS — M25552 Pain in left hip: Secondary | ICD-10-CM

## 2015-04-30 ENCOUNTER — Other Ambulatory Visit: Payer: Self-pay | Admitting: Orthopedic Surgery

## 2015-04-30 DIAGNOSIS — M25551 Pain in right hip: Secondary | ICD-10-CM

## 2015-05-02 ENCOUNTER — Ambulatory Visit
Admission: RE | Admit: 2015-05-02 | Discharge: 2015-05-02 | Disposition: A | Discharge status: Home / Self Care | Payer: Federal, State, Local not specified - PPO | Source: Ambulatory Visit | Attending: Orthopedic Surgery | Admitting: Orthopedic Surgery

## 2015-05-02 DIAGNOSIS — M25551 Pain in right hip: Secondary | ICD-10-CM

## 2015-07-11 DIAGNOSIS — M8700 Idiopathic aseptic necrosis of unspecified bone: Secondary | ICD-10-CM | POA: Insufficient documentation

## 2015-07-11 DIAGNOSIS — Z96642 Presence of left artificial hip joint: Secondary | ICD-10-CM | POA: Insufficient documentation

## 2015-08-01 DIAGNOSIS — N816 Rectocele: Secondary | ICD-10-CM | POA: Insufficient documentation

## 2015-09-14 ENCOUNTER — Ambulatory Visit (INDEPENDENT_AMBULATORY_CARE_PROVIDER_SITE_OTHER): Payer: Federal, State, Local not specified - PPO | Admitting: Family Medicine

## 2015-09-14 ENCOUNTER — Encounter: Payer: Self-pay | Admitting: Family Medicine

## 2015-09-14 VITALS — BP 124/82 | Ht 64.0 in | Wt 150.0 lb

## 2015-09-14 DIAGNOSIS — S76011A Strain of muscle, fascia and tendon of right hip, initial encounter: Secondary | ICD-10-CM | POA: Insufficient documentation

## 2015-09-14 DIAGNOSIS — M79604 Pain in right leg: Secondary | ICD-10-CM | POA: Diagnosis not present

## 2015-09-14 MED ORDER — METHYLPREDNISOLONE ACETATE 40 MG/ML IJ SUSP
40.0000 mg | Freq: Once | INTRAMUSCULAR | Status: AC
  Administered 2015-09-14: 40 mg via INTRA_ARTICULAR

## 2015-09-14 NOTE — Progress Notes (Signed)
Subjective:    Patient ID: Cynthia Gordon, female    DOB: Jul 25, 1961, 55 y.o.   MRN: [id number]  Cynthia Gordon is a 55 y.o. female presenting on 09/14/2015 for No chief complaint on file.  HPI  RIGHT HAMSTRING PAIN: - Patient presents with chronic Right hamstring muscle pain following initial injury 01/2015 with heavy lifting after assisting to pull / transfer her husband's father while in home hospice - She describes persistent aching pain in proximal Right hamstring muscles in gluteal region, states she has tried to rest it and allow it to heal but no improvement. Pain is worse after prolonged sitting, especially driving, then stiff and very painful when starts to walk again. 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 This is been lingering and she denies any paresthesias going distally. She is not done any formal physical therapy to date.   Past Medical History  Diagnosis Date  . Hypertension   . Hx of migraines   . Environmental allergies   . Asthma   . GERD (gastroesophageal reflux disease)   . Depression with anxiety   . History of nuclear stress test 03/08/2013    lexiscan; normal study; EF 59%  . Bronchitis     hx of  . Anxiety   . Osteoporosis   . Seasonal allergies     Social History   Social History  . Marital Status: Married    Spouse Name: N/A  . Number of Children: N/A  . Years of Education: N/A   Occupational History  . Not on file.   Social History Main Topics  . Smoking status: Never Smoker   . Smokeless tobacco: Never Used  . Alcohol Use: Yes     Comment: social  . Drug Use: No  . Sexual Activity: Not on file   Other Topics Concern  . Not on file   Social History Narrative    Current Outpatient Prescriptions on File Prior to Visit  Medication Sig  .  amLODipine (NORVASC) 5 MG tablet Take 5 mg by mouth daily.  Marland Kitchen buPROPion (WELLBUTRIN XL) 150 MG 24 hr tablet Take 300 mg by mouth daily.   Marland Kitchen co-enzyme Q-10 30 MG capsule Take 30 mg by mouth daily.  Marland Kitchen dexlansoprazole (DEXILANT) 60 MG capsule Take 60 mg by mouth daily.  . DULoxetine (CYMBALTA) 30 MG capsule Take 30 mg by mouth daily.  . hyoscyamine (LEVBID) 0.375 MG 12 hr tablet Take 1 tablet (0.375 mg total) by mouth every 12 (twelve) hours as needed.  Marland Kitchen ipratropium (ATROVENT) 0.03 % nasal spray Place 2 sprays into the nose 2 (two) times daily.  Marland Kitchen ketoprofen (ORUDIS) 75 MG capsule Take 75 mg by mouth 4 (four) times daily as needed for pain.   Marland Kitchen loratadine (CLARITIN) 10 MG tablet Take 10 mg by mouth daily.  . montelukast (SINGULAIR) 10 MG tablet Take 10 mg by mouth at bedtime.  . Potassium Gluconate 595 MG CAPS Take 595 mg by mouth daily.  . promethazine (PHENERGAN) 12.5 MG tablet Take 1 tablet (12.5 mg total) by mouth every 6 (six) hours as needed for nausea or vomiting.  . sucralfate (CARAFATE) 1 G tablet Take 1 g by mouth 4 (four) times daily.  . SUMAtriptan (IMITREX) 100 MG tablet Take 100 mg by mouth every 2 (  two) hours as needed for migraine.   . topiramate (TOPAMAX) 100 MG tablet Take 100 mg by mouth daily.   . traMADol (ULTRAM) 50 MG tablet Take 2 tablets (100 mg total) by mouth every 6 (six) hours as needed for pain.  . valsartan-hydrochlorothiazide (DIOVAN-HCT) 320-25 MG per tablet Take 1 tablet by mouth daily.  . vitamin B-12 (CYANOCOBALAMIN) 1000 MCG tablet Take 1,000 mcg by mouth daily.  Marland Kitchen. zolpidem (AMBIEN) 5 MG tablet Take 2.5 mg by mouth at bedtime as needed for sleep.    Current Facility-Administered Medications on File Prior to Visit  Medication  . ondansetron (ZOFRAN-ODT) disintegrating tablet 8 mg    Review of Systems Per HPI unless specifically indicated above     Objective:    BP 124/82 mmHg  Ht 5\' 4"  (1.626 m)  Wt 150 lb (68.04 kg)  BMI 25.73 kg/m2  Wt Readings  from Last 3 Encounters:  09/14/15 150 lb (68.04 kg)  04/28/13 151 lb (68.493 kg)  03/10/13 155 lb (70.308 kg)    Physical Exam  Gen: NAD, AAO 3 Cardiorespiratory - Normal respiratory effort/rate.  RRR Skin: No rashes or erythema Extremities: No edema, pulses +2 bilateral upper and lower extremity R hip: No gross deformity, leg length discrepency, or ecchymosis/bruising.  TTP posterior GT region.  ROM normal in all planes.  MS 5/5 in all directions.  Pain with ER of the R hip.  Neurovascularly in tact distally.  US: Posterior thigh evaluation shows hypoechoic changes in the obturator externus no obvious tearing. Her hamstring tendons appear normal. Otherwise is no anechoic hyperechoic or calcific region present

## 2015-09-14 NOTE — Assessment & Plan Note (Signed)
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 -Injection today under ultrasound guidance. Risk of possible sciatic nerve irritation was explained in detail in patient is acceptable to risk. -We'll also start strengthening excellent rotation and hip abduction exercises. Can consider decompression in the region but this is going to be hard to obtain. Can utilize Aleve as needed. -Follow-up in 4 weeks to reassess how she is doing.   Aspiration/Injection Procedure Note Cynthia Gordon 05/31/1961  Procedure: Injection Indications: R obturator externus partial tear/pain   Procedure Details Consent: Risks of procedure as well as the alternatives and risks of each were explained to the (patient/caregiver).  Consent for procedure obtained. Time Out: Verified patient identification, verified procedure, site/side was marked, verified correct patient position, special equipment/implants available, medications/allergies/relevent history reviewed, required imaging and test results available.  Performed.  The area was cleaned with iodine and alcohol swabs.    The Right obturator externus was injected using 1 cc's of 40mg  Depomedrol and 1 cc's of 1% lidocaine with a spinal needle.  Ultrasound was used. Images were obtained in Transverse and Long views showing the injection.  The sciatic nerve was identified and avoided.    Patient did tolerate procedure well. Estimated blood loss: None

## 2015-10-05 ENCOUNTER — Ambulatory Visit: Payer: Federal, State, Local not specified - PPO | Admitting: Family Medicine

## 2016-01-11 ENCOUNTER — Emergency Department (HOSPITAL_COMMUNITY)
Admission: EM | Admit: 2016-01-11 | Discharge: 2016-01-12 | Disposition: A | Discharge status: Home / Self Care | Payer: Federal, State, Local not specified - PPO | Attending: Emergency Medicine | Admitting: Emergency Medicine

## 2016-01-11 ENCOUNTER — Encounter (HOSPITAL_COMMUNITY): Payer: Self-pay | Admitting: *Deleted

## 2016-01-11 ENCOUNTER — Emergency Department (HOSPITAL_COMMUNITY): Payer: Federal, State, Local not specified - PPO

## 2016-01-11 DIAGNOSIS — R0602 Shortness of breath: Secondary | ICD-10-CM

## 2016-01-11 DIAGNOSIS — K219 Gastro-esophageal reflux disease without esophagitis: Secondary | ICD-10-CM | POA: Diagnosis not present

## 2016-01-11 DIAGNOSIS — Z79899 Other long term (current) drug therapy: Secondary | ICD-10-CM | POA: Insufficient documentation

## 2016-01-11 DIAGNOSIS — Z880 Allergy status to penicillin: Secondary | ICD-10-CM | POA: Diagnosis not present

## 2016-01-11 DIAGNOSIS — J45901 Unspecified asthma with (acute) exacerbation: Secondary | ICD-10-CM | POA: Insufficient documentation

## 2016-01-11 DIAGNOSIS — G43909 Migraine, unspecified, not intractable, without status migrainosus: Secondary | ICD-10-CM | POA: Insufficient documentation

## 2016-01-11 DIAGNOSIS — R140 Abdominal distension (gaseous): Secondary | ICD-10-CM | POA: Insufficient documentation

## 2016-01-11 DIAGNOSIS — F419 Anxiety disorder, unspecified: Secondary | ICD-10-CM | POA: Diagnosis not present

## 2016-01-11 DIAGNOSIS — F329 Major depressive disorder, single episode, unspecified: Secondary | ICD-10-CM | POA: Diagnosis not present

## 2016-01-11 DIAGNOSIS — R1084 Generalized abdominal pain: Secondary | ICD-10-CM | POA: Diagnosis not present

## 2016-01-11 DIAGNOSIS — I10 Essential (primary) hypertension: Secondary | ICD-10-CM | POA: Insufficient documentation

## 2016-01-11 DIAGNOSIS — R109 Unspecified abdominal pain: Secondary | ICD-10-CM | POA: Diagnosis not present

## 2016-01-11 DIAGNOSIS — R600 Localized edema: Secondary | ICD-10-CM | POA: Insufficient documentation

## 2016-01-11 DIAGNOSIS — Z9889 Other specified postprocedural states: Secondary | ICD-10-CM

## 2016-01-11 LAB — COMPREHENSIVE METABOLIC PANEL
ALT: 73 U/L — ABNORMAL HIGH (ref 14–54)
AST: 45 U/L — ABNORMAL HIGH (ref 15–41)
Albumin: 4.2 g/dL (ref 3.5–5.0)
Alkaline Phosphatase: 86 U/L (ref 38–126)
Anion gap: 10 (ref 5–15)
BUN: 12 mg/dL (ref 6–20)
CO2: 26 mmol/L (ref 22–32)
Calcium: 9.9 mg/dL (ref 8.9–10.3)
Chloride: 107 mmol/L (ref 101–111)
Creatinine, Ser: 1.01 mg/dL — ABNORMAL HIGH (ref 0.44–1.00)
GFR calc Af Amer: >60 mL/min (ref >60)
GFR calc non Af Amer: >60 mL/min (ref >60)
Glucose, Bld: 90 mg/dL (ref 65–99)
Potassium: 3.8 mmol/L (ref 3.5–5.1)
Sodium: 143 mmol/L (ref 135–145)
Total Bilirubin: 0.5 mg/dL (ref 0.3–1.2)
Total Protein: 7.2 g/dL (ref 6.5–8.1)

## 2016-01-11 LAB — CBC
HCT: 37.8 % (ref 36.0–46.0)
Hemoglobin: 11.8 g/dL — ABNORMAL LOW (ref 12.0–15.0)
MCH: 28 pg (ref 26.0–34.0)
MCHC: 31.2 g/dL (ref 30.0–36.0)
MCV: 89.8 fL (ref 78.0–100.0)
Platelets: 307 10*3/uL (ref 150–400)
RBC: 4.21 MIL/uL (ref 3.87–5.11)
RDW: 13.5 % (ref 11.5–15.5)
WBC: 4.5 10*3/uL (ref 4.0–10.5)

## 2016-01-11 LAB — URINALYSIS, ROUTINE W REFLEX MICROSCOPIC
Bilirubin Urine: NEGATIVE
Glucose, UA: NEGATIVE mg/dL
Ketones, ur: NEGATIVE mg/dL
Leukocytes, UA: NEGATIVE
Nitrite: NEGATIVE
Protein, ur: NEGATIVE mg/dL
Specific Gravity, Urine: 1.006 (ref 1.005–1.030)
pH: 6.5 (ref 5.0–8.0)

## 2016-01-11 LAB — URINE MICROSCOPIC-ADD ON: WBC, UA: NONE SEEN WBC/hpf (ref 0–5)

## 2016-01-11 LAB — LIPASE, BLOOD: Lipase: 46 U/L (ref 11–51)

## 2016-01-11 LAB — D-DIMER, QUANTITATIVE: D-Dimer, Quant: 0.98 ug/mL-FEU — ABNORMAL HIGH (ref 0.00–0.50)

## 2016-01-11 MED ORDER — IOPAMIDOL (ISOVUE-370) INJECTION 76%
INTRAVENOUS | Status: AC
  Administered 2016-01-11: 100 mL
  Filled 2016-01-11: qty 100

## 2016-01-11 MED ORDER — DIATRIZOATE MEGLUMINE & SODIUM 66-10 % PO SOLN
ORAL | Status: AC
  Filled 2016-01-11: qty 30

## 2016-01-11 NOTE — ED Notes (Signed)
The pt has her tummy tucked and lipo suction in march  By a doctor in [place].  For one week she has had feet and ankle swelling and her abd has been distended  lmp none

## 2016-01-11 NOTE — ED Provider Notes (Signed)
CSN: [id number]     Arrival date & time 01/11/16  1725 History   First MD Initiated Contact with Patient 01/11/16 1957     Chief Complaint  Patient presents with  . Abdominal Pain     (Consider location/radiation/quality/duration/timing/severity/associated sxs/prior Treatment) HPI Comments: 55 year old female with a history of hypertension, asthma, esophageal reflux, and anxiety presents to the emergency department for evaluation of abdominal distention and pain. Patient states that she is status post tummy tuck and bilateral flank like a suction performed by Dr. Pricilla Holm on 11/15/2015. She reports that she did not have drains placed during this procedure. She has been presenting to her plastic surgeon's office for needle aspiration of fluid from her abdomen on a 1-2 weekly basis since her surgery. Patient states that her overall drainage has decreased since her procedure. She last had 40 mL aspirated off of her abdomen 1.5 weeks ago. She is not scheduled for follow-up again until May 15th.   Patient states that she has noted a progressive, dry cough over the past few weeks associated with dyspnea on exertion as well as generalized fatigue. Over the past one week, the patient reports intermittent swelling to her ankle and feet which improves with elevation. She has also had a generalized abdominal discomfort and increasing abdominal distention. With this distention she has noticed a weight gain of approximately 10 pounds. She states that she was weighed in at 160 pounds today and was 149 pounds postop. She describes her abdominal pain is aching. Patient has taken Advil with mild relief. She denies any fever, nausea, vomiting, bowel changes, syncope, or urinary symptoms. Past abdominal surgeries significant for hysterectomy. She states that she called her plastic surgeon's office today and was told to be evaluated to rule out a blood clot. No personal or FHx of DVT or PE.  Patient is a 55 y.o. female  presenting with abdominal pain. The history is provided by the patient. No language interpreter was used.  Abdominal Pain Associated symptoms: shortness of breath (dyspnea on exertion)   Associated symptoms: no chest pain, no diarrhea, no fever and no vomiting     Past Medical History  Diagnosis Date  . Hypertension   . Hx of migraines   . Environmental allergies   . Asthma   . GERD (gastroesophageal reflux disease)   . Depression with anxiety   . History of nuclear stress test 03/08/2013    lexiscan; normal study; EF 59%  . Bronchitis     hx of  . Anxiety   . Osteoporosis   . Seasonal allergies    Past Surgical History  Procedure Laterality Date  . Abdominal hysterectomy    . Total hip arthroplasty Left   . Rotator cuff repair Bilateral   . Wrist surgery Right     plate and screws in wrist  . Incision and drainage forearm / wrist deep Right 04/28/2013    Dr Magnus Ivan  . I&d extremity Right 04/28/2013    Procedure: IRRIGATION AND DEBRIDEMENT RIGHT WRIST/FOREARM;  Surgeon: Kathryne Hitch, MD;  [facility]: [facility] OR;  Service: Orthopedics;  Laterality: Right;   Family History  Problem Relation Age of Onset  . Cancer - Lung Mother   . Cancer - Colon Father   . Healthy Brother   . Cancer - Other Maternal Grandmother 80    brain cancer from melonoma  . Cancer - Other Maternal Grandfather 80    stomach cancer  . Cancer - Lung Paternal Grandfather    Social  History  Substance Use Topics  . Smoking status: Never Smoker   . Smokeless tobacco: Never Used  . Alcohol Use: Yes     Comment: social   OB History    No data available      Review of Systems  Constitutional: Negative for fever.  Respiratory: Positive for shortness of breath (dyspnea on exertion).   Cardiovascular: Positive for leg swelling. Negative for chest pain.  Gastrointestinal: Positive for abdominal pain and abdominal distention. Negative for vomiting and diarrhea.  Neurological: Negative for syncope.   All other systems reviewed and are negative.   Allergies  Amoxicillin; Codeine; Demerol; Morphine and related; Penicillin g; Septra; and Tetracyclines & related  Home Medications   Prior to Admission medications   Medication Sig Start Date End Date Taking? Authorizing Provider  ALPRAZolam (XANAX XR) 1 MG 24 hr tablet TAKE 1 TABLET EVERY DAY AS NEEDED 08/17/15   Historical Provider, MD  amLODipine (NORVASC) 5 MG tablet Take 5 mg by mouth daily.    Historical Provider, MD  buPROPion (WELLBUTRIN XL) 150 MG 24 hr tablet Take 300 mg by mouth daily.     Historical Provider, MD  dexlansoprazole (DEXILANT) 60 MG capsule Take 60 mg by mouth daily.    Historical Provider, MD  DULoxetine (CYMBALTA) 30 MG capsule Take 60 mg by mouth daily.     Historical Provider, MD  DULoxetine (CYMBALTA) 60 MG capsule Take 60 mg by mouth daily. 07/14/15   Historical Provider, MD  hyoscyamine (LEVBID) 0.375 MG 12 hr tablet Take 1 tablet (0.375 mg total) by mouth every 12 (twelve) hours as needed. 09/25/13   Ethelda Chick, MD  ipratropium (ATROVENT) 0.03 % nasal spray Place 2 sprays into the nose 2 (two) times daily. 03/23/12 03/23/13  Heather Jaquita Rector, PA-C  loratadine (CLARITIN) 10 MG tablet Take 10 mg by mouth daily.    Historical Provider, MD  Omega-3 1000 MG CAPS Take 1 g by mouth.    Historical Provider, MD  Red Yeast Rice Extract 600 MG CAPS Take by mouth.    Historical Provider, MD  SUMAtriptan (IMITREX) 100 MG tablet Take 100 mg by mouth every 2 (two) hours as needed for migraine.     Historical Provider, MD  topiramate (TOPAMAX) 100 MG tablet Take 100 mg by mouth daily.     Historical Provider, MD  traMADol (ULTRAM) 50 MG tablet Take 2 tablets (100 mg total) by mouth every 6 (six) hours as needed for pain. 04/29/13   Kathryne Hitch, MD  valsartan-hydrochlorothiazide (DIOVAN-HCT) 320-25 MG per tablet Take 1 tablet by mouth daily.    Historical Provider, MD  vitamin E 100 UNIT capsule Take by mouth.     Historical Provider, MD  zolpidem (AMBIEN) 5 MG tablet Take 2.5 mg by mouth at bedtime as needed for sleep.     Historical Provider, MD   BP 112/79 mmHg  Pulse 80  Temp(Src) 97.7 F (36.5 C) (Oral)  Resp 20  Wt 72.576 kg  SpO2 97%   Physical Exam  Constitutional: She is oriented to person, place, and time. She appears well-developed and well-nourished. No distress.  Nontoxic appearing  HENT:  Head: Normocephalic and atraumatic.  Eyes: Conjunctivae and EOM are normal. No scleral icterus.  Neck: Normal range of motion.  Cardiovascular: Normal rate, regular rhythm and intact distal pulses.   Pulmonary/Chest: Effort normal. No respiratory distress. She has no wheezes.  Respirations even and unlabored  Abdominal: Soft. She exhibits distension. There is tenderness (  generalized). There is no rebound and no guarding.  Musculoskeletal: Normal range of motion. She exhibits edema.  Trace pitting edema in BLE  Neurological: She is alert and oriented to person, place, and time. She exhibits normal muscle tone. Coordination normal.  GCS 15. Patient ambulatory with steady gait.  Skin: Skin is warm and dry. No rash noted. She is not diaphoretic. No erythema. No pallor.  Psychiatric: She has a normal mood and affect. Her behavior is normal.  Nursing note and vitals reviewed.   ED Course  Procedures (including critical care time) Labs Review Labs Reviewed  COMPREHENSIVE METABOLIC PANEL - Abnormal; Notable for the following:    Creatinine, Ser 1.01 (*)    AST 45 (*)    ALT 73 (*)    All other components within normal limits  CBC - Abnormal; Notable for the following:    Hemoglobin 11.8 (*)    All other components within normal limits  URINALYSIS, ROUTINE W REFLEX MICROSCOPIC (NOT AT [facility]) - Abnormal; Notable for the following:    Hgb urine dipstick SMALL (*)    All other components within normal limits  URINE MICROSCOPIC-ADD ON - Abnormal; Notable for the following:    Squamous Epithelial  / LPF 0-5 (*)    Bacteria, UA RARE (*)    All other components within normal limits  D-DIMER, QUANTITATIVE (NOT AT [facility]) - Abnormal; Notable for the following:    D-Dimer, Quant 0.98 (*)    All other components within normal limits  LIPASE, BLOOD    Imaging Review Ct Angio Chest Pe W/cm &/or Wo Cm  01/11/2016  CLINICAL DATA:  Increasing shortness of breath. Abdominal pain and distention. EXAM: CT ANGIOGRAPHY CHEST CT ABDOMEN AND PELVIS WITH CONTRAST TECHNIQUE: Multidetector CT imaging of the chest was performed using the standard protocol during bolus administration of intravenous contrast. Multiplanar CT image reconstructions and MIPs were obtained to evaluate the vascular anatomy. Multidetector CT imaging of the abdomen and pelvis was performed using the standard protocol during bolus administration of intravenous contrast. CONTRAST:  100 mL of Isovue 370 COMPARISON:  None. FINDINGS: CTA CHEST FINDINGS The central airways are normal. No pneumothorax. Platelike opacity in the right apex is consistent with atelectasis. Scattered subsegmental atelectasis seen in the lungs. Opacity in the left base abutting the diaphragm is consistent with atelectasis well. No suspicious nodules, masses, or infiltrates. The thoracic aorta is normal in caliber. No pulmonary emboli are identified. No adenopathy. No effusions. The heart size is unremarkable. No other abnormalities in the chest. CT ABDOMEN and PELVIS FINDINGS No free air or free fluid. The liver, gallbladder, spleen, adrenal glands, and kidneys are normal. The attenuation in the pancreatic head is slightly diminished such as on image 41 of series 601. However, there is no discrete mass and the finding likely represents a small amount of fatty deposition in the head of pancreas. No convincing evidence of pancreatic mass identified on study. No peripancreatic stranding to suggest pancreatitis. The abdominal aorta is non aneurysmal. No adenopathy. The stomach and  small bowel are within normal limits. The colon and appendix are within normal limits. No other abnormalities are seen within the abdomen. Postoperative changes are seen in the anterior abdominal wall with no evidence of abscess or acute abnormality. No adenopathy or mass in the pelvis. Patient status post hysterectomy. No suspicious pelvic masses. The bladder is unremarkable. The patient is status post left hip replacement. Visualized bones are otherwise unremarkable. Review of the MIP images confirms the above  findings. IMPRESSION: 1. No pulmonary emboli. 2. No acute abdominal abnormalities. Electronically Signed   By: Gerome Sam III M.D   On: 01/11/2016 23:49   Ct Abdomen Pelvis W Contrast  01/11/2016  CLINICAL DATA:  Increasing shortness of breath. Abdominal pain and distention. EXAM: CT ANGIOGRAPHY CHEST CT ABDOMEN AND PELVIS WITH CONTRAST TECHNIQUE: Multidetector CT imaging of the chest was performed using the standard protocol during bolus administration of intravenous contrast. Multiplanar CT image reconstructions and MIPs were obtained to evaluate the vascular anatomy. Multidetector CT imaging of the abdomen and pelvis was performed using the standard protocol during bolus administration of intravenous contrast. CONTRAST:  100 mL of Isovue 370 COMPARISON:  None. FINDINGS: CTA CHEST FINDINGS The central airways are normal. No pneumothorax. Platelike opacity in the right apex is consistent with atelectasis. Scattered subsegmental atelectasis seen in the lungs. Opacity in the left base abutting the diaphragm is consistent with atelectasis well. No suspicious nodules, masses, or infiltrates. The thoracic aorta is normal in caliber. No pulmonary emboli are identified. No adenopathy. No effusions. The heart size is unremarkable. No other abnormalities in the chest. CT ABDOMEN and PELVIS FINDINGS No free air or free fluid. The liver, gallbladder, spleen, adrenal glands, and kidneys are normal. The  attenuation in the pancreatic head is slightly diminished such as on image 41 of series 601. However, there is no discrete mass and the finding likely represents a small amount of fatty deposition in the head of pancreas. No convincing evidence of pancreatic mass identified on study. No peripancreatic stranding to suggest pancreatitis. The abdominal aorta is non aneurysmal. No adenopathy. The stomach and small bowel are within normal limits. The colon and appendix are within normal limits. No other abnormalities are seen within the abdomen. Postoperative changes are seen in the anterior abdominal wall with no evidence of abscess or acute abnormality. No adenopathy or mass in the pelvis. Patient status post hysterectomy. No suspicious pelvic masses. The bladder is unremarkable. The patient is status post left hip replacement. Visualized bones are otherwise unremarkable. Review of the MIP images confirms the above findings. IMPRESSION: 1. No pulmonary emboli. 2. No acute abdominal abnormalities. Electronically Signed   By: Gerome Sam III M.D   On: 01/11/2016 23:49     I have personally reviewed and evaluated these images and lab results as part of my medical decision-making.   EKG Interpretation None      MDM   Final diagnoses:  Generalized abdominal pain  S/P abdominoplasty  Shortness of breath    55 year old female since to the emergency department for evaluation of the sensation of abdominal distention and bloating with associated generalized abdominal ache which has been constant and progressive over one week. She has noticed a mild cough as well as dyspnea on exertion and fatigue. She has history of a recent abdominoplasty approximately 8 weeks ago. She states that she has been seeing her surgeon regularly to have fluid aspirated from her surgical wound site.  Patient is well and nontoxic appearing today. She is afebrile with stable vital signs. Laboratory workup today is reassuring.  Patient has no leukocytosis. Patient expressed concern for blood clots. A d-dimer was performed which was slightly elevated today. A CTA was subsequently ordered to evaluate for pulmonary embolus in addition to a CT abdomen pelvis to further evaluate abdominal pain. Imaging today shows no evidence of acute surgical or emergent findings. CTA negative for PE.  Suspect nonemergent cause of mild dependent edema. Abdominal swelling may also be  due to inflammatory changes from recent surgery. It has been recommended the patient follow-up with her surgeon for further evaluation of her symptoms. Return precautions discussed and provided. Patient discharged in satisfactory condition with no unaddressed concerns.    Filed Vitals:   01/11/16 2200 01/11/16 2230 01/11/16 2345 01/12/16 0000  BP: 126/83 112/79 111/74 114/76  Pulse: 88 80 82 81  Temp:      TempSrc:      Resp: 20     Weight:      SpO2: 97% 97% 96% 96%     Antony Madura, PA-C 01/12/16 0008  Jerelyn Scott, MD 01/12/16 1610

## 2016-01-12 NOTE — Discharge Instructions (Signed)
We recommend that you take Tylenol or ibuprofen for any pain. Follow-up with your surgeon as soon as you are able for reevaluation of your symptoms. Your d-dimer, a marker for blood clots, was mildly elevated today. You had a CT scan of your chest which did not show any blood clots (pulmonary embolus). You also had a CT scan of your abdomen and pelvis which did not show any concerning or surgical findings. Your blood work was also reassuring.  It is possible that you may have a small amount of fluid not able to be visualized on CT causing your sensation of bloating and swelling. This may also be simply from inflammation associated with your surgical procedure. Return to the Emergency Department if you develop severe worsening of your pain, vomiting, or fever over 100.25F.

## 2016-01-12 NOTE — ED Notes (Signed)
Pt A&Ox4, ambulatory with steady gait at d/c, NAD

## 2016-01-12 NOTE — ED Notes (Signed)
Patient stable and ambulatory. Patient verbalized understanding of the discharge instructions.   

## 2016-01-14 DIAGNOSIS — D2272 Melanocytic nevi of left lower limb, including hip: Secondary | ICD-10-CM | POA: Diagnosis not present

## 2016-01-14 DIAGNOSIS — L280 Lichen simplex chronicus: Secondary | ICD-10-CM | POA: Diagnosis not present

## 2016-01-14 DIAGNOSIS — L570 Actinic keratosis: Secondary | ICD-10-CM | POA: Diagnosis not present

## 2016-01-14 DIAGNOSIS — D225 Melanocytic nevi of trunk: Secondary | ICD-10-CM | POA: Diagnosis not present

## 2016-01-14 DIAGNOSIS — L700 Acne vulgaris: Secondary | ICD-10-CM | POA: Diagnosis not present

## 2016-02-19 DIAGNOSIS — F4322 Adjustment disorder with anxiety: Secondary | ICD-10-CM | POA: Diagnosis not present

## 2016-03-20 DIAGNOSIS — E7800 Pure hypercholesterolemia, unspecified: Secondary | ICD-10-CM | POA: Diagnosis not present

## 2016-03-20 DIAGNOSIS — N390 Urinary tract infection, site not specified: Secondary | ICD-10-CM | POA: Diagnosis not present

## 2016-03-20 DIAGNOSIS — E559 Vitamin D deficiency, unspecified: Secondary | ICD-10-CM | POA: Diagnosis not present

## 2016-03-20 DIAGNOSIS — Z0000 Encounter for general adult medical examination without abnormal findings: Secondary | ICD-10-CM | POA: Diagnosis not present

## 2016-03-25 ENCOUNTER — Other Ambulatory Visit: Payer: Self-pay | Admitting: Obstetrics and Gynecology

## 2016-03-25 DIAGNOSIS — Z1231 Encounter for screening mammogram for malignant neoplasm of breast: Secondary | ICD-10-CM

## 2016-03-27 DIAGNOSIS — R062 Wheezing: Secondary | ICD-10-CM | POA: Diagnosis not present

## 2016-03-27 DIAGNOSIS — I10 Essential (primary) hypertension: Secondary | ICD-10-CM | POA: Diagnosis not present

## 2016-03-27 DIAGNOSIS — E7800 Pure hypercholesterolemia, unspecified: Secondary | ICD-10-CM | POA: Diagnosis not present

## 2016-03-27 DIAGNOSIS — R609 Edema, unspecified: Secondary | ICD-10-CM | POA: Diagnosis not present

## 2016-03-27 DIAGNOSIS — Z0000 Encounter for general adult medical examination without abnormal findings: Secondary | ICD-10-CM | POA: Diagnosis not present

## 2016-03-27 DIAGNOSIS — M1990 Unspecified osteoarthritis, unspecified site: Secondary | ICD-10-CM | POA: Diagnosis not present

## 2016-04-03 ENCOUNTER — Ambulatory Visit: Payer: Federal, State, Local not specified - PPO

## 2016-04-09 DIAGNOSIS — Z96642 Presence of left artificial hip joint: Secondary | ICD-10-CM | POA: Diagnosis not present

## 2016-04-09 DIAGNOSIS — M545 Low back pain: Secondary | ICD-10-CM | POA: Diagnosis not present

## 2016-04-09 DIAGNOSIS — M79642 Pain in left hand: Secondary | ICD-10-CM | POA: Diagnosis not present

## 2016-04-09 DIAGNOSIS — M79641 Pain in right hand: Secondary | ICD-10-CM | POA: Diagnosis not present

## 2016-04-10 ENCOUNTER — Ambulatory Visit: Payer: Federal, State, Local not specified - PPO

## 2016-04-10 ENCOUNTER — Other Ambulatory Visit: Payer: Self-pay | Admitting: Orthopedic Surgery

## 2016-04-10 DIAGNOSIS — M545 Low back pain: Secondary | ICD-10-CM

## 2016-04-20 ENCOUNTER — Ambulatory Visit
Admission: RE | Admit: 2016-04-20 | Discharge: 2016-04-20 | Disposition: A | Discharge status: Home / Self Care | Payer: Federal, State, Local not specified - PPO | Source: Ambulatory Visit | Attending: Orthopedic Surgery | Admitting: Orthopedic Surgery

## 2016-04-20 DIAGNOSIS — M545 Low back pain: Secondary | ICD-10-CM

## 2016-04-20 DIAGNOSIS — M5126 Other intervertebral disc displacement, lumbar region: Secondary | ICD-10-CM | POA: Diagnosis not present

## 2016-04-30 DIAGNOSIS — F4322 Adjustment disorder with anxiety: Secondary | ICD-10-CM | POA: Diagnosis not present

## 2016-05-07 DIAGNOSIS — M79642 Pain in left hand: Secondary | ICD-10-CM | POA: Diagnosis not present

## 2016-05-07 DIAGNOSIS — M545 Low back pain: Secondary | ICD-10-CM | POA: Diagnosis not present

## 2016-05-28 DIAGNOSIS — Z23 Encounter for immunization: Secondary | ICD-10-CM | POA: Diagnosis not present

## 2016-05-28 DIAGNOSIS — E7800 Pure hypercholesterolemia, unspecified: Secondary | ICD-10-CM | POA: Diagnosis not present

## 2016-07-04 DIAGNOSIS — Z1231 Encounter for screening mammogram for malignant neoplasm of breast: Secondary | ICD-10-CM | POA: Diagnosis not present

## 2016-07-04 DIAGNOSIS — M816 Localized osteoporosis [Lequesne]: Secondary | ICD-10-CM | POA: Diagnosis not present

## 2016-07-04 DIAGNOSIS — Z13820 Encounter for screening for osteoporosis: Secondary | ICD-10-CM | POA: Diagnosis not present

## 2016-07-04 DIAGNOSIS — N959 Unspecified menopausal and perimenopausal disorder: Secondary | ICD-10-CM | POA: Diagnosis not present

## 2016-07-10 ENCOUNTER — Other Ambulatory Visit: Payer: Self-pay | Admitting: Obstetrics and Gynecology

## 2016-07-10 DIAGNOSIS — R928 Other abnormal and inconclusive findings on diagnostic imaging of breast: Secondary | ICD-10-CM

## 2016-07-14 ENCOUNTER — Ambulatory Visit
Admission: RE | Admit: 2016-07-14 | Discharge: 2016-07-14 | Disposition: A | Discharge status: Home / Self Care | Payer: Federal, State, Local not specified - PPO | Source: Ambulatory Visit | Attending: Obstetrics and Gynecology | Admitting: Obstetrics and Gynecology

## 2016-07-14 DIAGNOSIS — R928 Other abnormal and inconclusive findings on diagnostic imaging of breast: Secondary | ICD-10-CM

## 2016-07-23 DIAGNOSIS — M818 Other osteoporosis without current pathological fracture: Secondary | ICD-10-CM | POA: Diagnosis not present

## 2016-08-18 ENCOUNTER — Emergency Department (HOSPITAL_COMMUNITY): Payer: Federal, State, Local not specified - PPO

## 2016-08-18 ENCOUNTER — Emergency Department (HOSPITAL_COMMUNITY)
Admission: EM | Admit: 2016-08-18 | Discharge: 2016-08-18 | Disposition: A | Discharge status: Home / Self Care | Payer: Federal, State, Local not specified - PPO | Attending: Emergency Medicine | Admitting: Emergency Medicine

## 2016-08-18 ENCOUNTER — Encounter (HOSPITAL_COMMUNITY): Payer: Self-pay | Admitting: Emergency Medicine

## 2016-08-18 DIAGNOSIS — J189 Pneumonia, unspecified organism: Secondary | ICD-10-CM | POA: Diagnosis not present

## 2016-08-18 DIAGNOSIS — I10 Essential (primary) hypertension: Secondary | ICD-10-CM | POA: Insufficient documentation

## 2016-08-18 DIAGNOSIS — R0789 Other chest pain: Secondary | ICD-10-CM

## 2016-08-18 DIAGNOSIS — J45909 Unspecified asthma, uncomplicated: Secondary | ICD-10-CM | POA: Insufficient documentation

## 2016-08-18 DIAGNOSIS — R079 Chest pain, unspecified: Secondary | ICD-10-CM | POA: Diagnosis not present

## 2016-08-18 DIAGNOSIS — J181 Lobar pneumonia, unspecified organism: Secondary | ICD-10-CM

## 2016-08-18 DIAGNOSIS — Z79899 Other long term (current) drug therapy: Secondary | ICD-10-CM | POA: Diagnosis not present

## 2016-08-18 DIAGNOSIS — Z96642 Presence of left artificial hip joint: Secondary | ICD-10-CM | POA: Insufficient documentation

## 2016-08-18 LAB — BASIC METABOLIC PANEL
Anion gap: 13 (ref 5–15)
BUN: 15 mg/dL (ref 6–20)
CO2: 21 mmol/L — ABNORMAL LOW (ref 22–32)
Calcium: 9.4 mg/dL (ref 8.9–10.3)
Chloride: 104 mmol/L (ref 101–111)
Creatinine, Ser: 1.15 mg/dL — ABNORMAL HIGH (ref 0.44–1.00)
GFR calc Af Amer: >60 mL/min (ref >60)
GFR calc non Af Amer: 53 mL/min — ABNORMAL LOW (ref >60)
Glucose, Bld: 116 mg/dL — ABNORMAL HIGH (ref 65–99)
Potassium: 3.5 mmol/L (ref 3.5–5.1)
Sodium: 138 mmol/L (ref 135–145)

## 2016-08-18 LAB — CBC
HCT: 38.3 % (ref 36.0–46.0)
Hemoglobin: 12.9 g/dL (ref 12.0–15.0)
MCH: 30 pg (ref 26.0–34.0)
MCHC: 33.7 g/dL (ref 30.0–36.0)
MCV: 89.1 fL (ref 78.0–100.0)
Platelets: 341 10*3/uL (ref 150–400)
RBC: 4.3 MIL/uL (ref 3.87–5.11)
RDW: 13.4 % (ref 11.5–15.5)
WBC: 6.1 10*3/uL (ref 4.0–10.5)

## 2016-08-18 LAB — I-STAT TROPONIN, ED
Troponin i, poc: 0 ng/mL (ref 0.00–0.08)
Troponin i, poc: 0 ng/mL (ref 0.00–0.08)

## 2016-08-18 MED ORDER — AZITHROMYCIN 500 MG IV SOLR
500.0000 mg | Freq: Once | INTRAVENOUS | Status: AC
  Administered 2016-08-18: 500 mg via INTRAVENOUS
  Filled 2016-08-18: qty 500

## 2016-08-18 MED ORDER — AZITHROMYCIN 250 MG PO TABS: 250.0000 mg | ORAL_TABLET | Freq: Every day | ORAL | 0 refills | Status: DC

## 2016-08-18 MED ORDER — KETOROLAC TROMETHAMINE 30 MG/ML IJ SOLN
30.0000 mg | Freq: Once | INTRAMUSCULAR | Status: AC
Start: 2016-08-18 — End: 2016-08-18
  Administered 2016-08-18: 30 mg via INTRAVENOUS
  Filled 2016-08-18: qty 1

## 2016-08-18 MED ORDER — OXYCODONE-ACETAMINOPHEN 5-325 MG PO TABS: 1.0000 | ORAL_TABLET | Freq: Three times a day (TID) | ORAL | 0 refills | Status: DC | PRN

## 2016-08-18 MED ORDER — IBUPROFEN 600 MG PO TABS: 600.0000 mg | ORAL_TABLET | Freq: Four times a day (QID) | ORAL | 0 refills | Status: DC | PRN

## 2016-08-18 MED ORDER — SODIUM CHLORIDE 0.9 % IV BOLUS (SEPSIS)
1000.0000 mL | Freq: Once | INTRAVENOUS | Status: AC
  Administered 2016-08-18: 1000 mL via INTRAVENOUS

## 2016-08-18 NOTE — ED Notes (Signed)
Pt SpO2 89-90% RA.  Placed pt on 1 L nasal cannula with increase to 94%.

## 2016-08-18 NOTE — ED Triage Notes (Signed)
Pt reports that she was fooling around with her boyfriend on the floor. Onset Thursday at midnight she began having pain to left rib area under her breast. Pt reports pain radiates to left shoulder blade.
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
## 2016-08-18 NOTE — Discharge Instructions (Signed)
Please read and follow all provided instructions.  Your diagnoses today include:  1. Chest wall pain   2. Community acquired pneumonia of left lower lobe of lung (HCC)     Tests performed today include: An EKG of your heart A chest x-ray Cardiac enzymes - a blood test for heart muscle damage Blood counts and electrolytes Vital signs. See below for your results today.   Medications prescribed:   Take any prescribed medications only as directed.  Follow-up instructions: Please follow-up with your primary care provider as soon as you can for further evaluation of your symptoms.   Return instructions:  SEEK IMMEDIATE MEDICAL ATTENTION IF: You have severe chest pain, especially if the pain is crushing or pressure-like and spreads to the arms, back, neck, or jaw, or if you have sweating, nausea (feeling sick to your stomach), or shortness of breath. THIS IS AN EMERGENCY. Don't wait to see if the pain will go away. Get medical help at once. Call 911 or 0 (operator). DO NOT drive yourself to the hospital.  Your chest pain gets worse and does not go away with rest.  You have an attack of chest pain lasting longer than usual, despite rest and treatment with the medications your caregiver has prescribed.  You wake from sleep with chest pain or shortness of breath. You feel dizzy or faint. You have chest pain not typical of your usual pain for which you originally saw your caregiver.  You have any other emergent concerns regarding your health.  Additional Information: Chest pain comes from many different causes. Your caregiver has diagnosed you as having chest pain that is not specific for one problem, but does not require admission.  You are at low risk for an acute heart condition or other serious illness.   Your vital signs today were: BP 102/66    Pulse 85    Temp 97.7 F (36.5 C)    Resp 17    Ht 5' 3.5" (1.613 m)    Wt 70.3 kg    SpO2 98%    BMI 27.03 kg/m  If your blood pressure (BP)  was elevated above 135/85 this visit, please have this repeated by your doctor within one month. --------------

## 2016-08-21 DIAGNOSIS — J159 Unspecified bacterial pneumonia: Secondary | ICD-10-CM | POA: Diagnosis not present

## 2016-09-18 DIAGNOSIS — J189 Pneumonia, unspecified organism: Secondary | ICD-10-CM | POA: Diagnosis not present

## 2016-09-18 DIAGNOSIS — J159 Unspecified bacterial pneumonia: Secondary | ICD-10-CM | POA: Diagnosis not present

## 2016-10-03 DIAGNOSIS — K219 Gastro-esophageal reflux disease without esophagitis: Secondary | ICD-10-CM | POA: Diagnosis not present

## 2016-10-07 DIAGNOSIS — R1013 Epigastric pain: Secondary | ICD-10-CM | POA: Diagnosis not present

## 2016-10-07 DIAGNOSIS — K3189 Other diseases of stomach and duodenum: Secondary | ICD-10-CM | POA: Diagnosis not present

## 2016-10-07 DIAGNOSIS — R12 Heartburn: Secondary | ICD-10-CM | POA: Diagnosis not present

## 2016-10-13 DIAGNOSIS — Z6826 Body mass index (BMI) 26.0-26.9, adult: Secondary | ICD-10-CM | POA: Diagnosis not present

## 2016-10-13 DIAGNOSIS — Z01419 Encounter for gynecological examination (general) (routine) without abnormal findings: Secondary | ICD-10-CM | POA: Diagnosis not present

## 2016-11-05 ENCOUNTER — Ambulatory Visit (INDEPENDENT_AMBULATORY_CARE_PROVIDER_SITE_OTHER): Payer: Federal, State, Local not specified - PPO | Admitting: Orthopedic Surgery

## 2016-11-06 ENCOUNTER — Ambulatory Visit (INDEPENDENT_AMBULATORY_CARE_PROVIDER_SITE_OTHER): Payer: Federal, State, Local not specified - PPO | Admitting: Orthopedic Surgery

## 2016-11-17 DIAGNOSIS — M791 Myalgia: Secondary | ICD-10-CM | POA: Diagnosis not present

## 2016-11-17 DIAGNOSIS — M130 Polyarthritis, unspecified: Secondary | ICD-10-CM | POA: Diagnosis not present

## 2016-11-17 DIAGNOSIS — M19041 Primary osteoarthritis, right hand: Secondary | ICD-10-CM | POA: Diagnosis not present

## 2016-11-17 DIAGNOSIS — M2550 Pain in unspecified joint: Secondary | ICD-10-CM | POA: Diagnosis not present

## 2016-11-17 DIAGNOSIS — M19042 Primary osteoarthritis, left hand: Secondary | ICD-10-CM | POA: Diagnosis not present

## 2016-11-17 DIAGNOSIS — M810 Age-related osteoporosis without current pathological fracture: Secondary | ICD-10-CM | POA: Diagnosis not present

## 2016-11-17 DIAGNOSIS — M79643 Pain in unspecified hand: Secondary | ICD-10-CM | POA: Diagnosis not present

## 2016-11-18 DIAGNOSIS — M2550 Pain in unspecified joint: Secondary | ICD-10-CM | POA: Diagnosis not present

## 2016-11-21 DIAGNOSIS — Z96642 Presence of left artificial hip joint: Secondary | ICD-10-CM | POA: Diagnosis not present

## 2016-11-21 DIAGNOSIS — M25552 Pain in left hip: Secondary | ICD-10-CM | POA: Diagnosis not present

## 2016-11-28 ENCOUNTER — Ambulatory Visit: Payer: Federal, State, Local not specified - PPO | Admitting: Family Medicine

## 2016-12-08 DIAGNOSIS — M810 Age-related osteoporosis without current pathological fracture: Secondary | ICD-10-CM | POA: Diagnosis not present

## 2016-12-08 DIAGNOSIS — M79643 Pain in unspecified hand: Secondary | ICD-10-CM | POA: Diagnosis not present

## 2016-12-08 DIAGNOSIS — M2550 Pain in unspecified joint: Secondary | ICD-10-CM | POA: Diagnosis not present

## 2016-12-08 DIAGNOSIS — M1990 Unspecified osteoarthritis, unspecified site: Secondary | ICD-10-CM | POA: Diagnosis not present

## 2016-12-11 DIAGNOSIS — M67471 Ganglion, right ankle and foot: Secondary | ICD-10-CM | POA: Diagnosis not present

## 2016-12-11 DIAGNOSIS — D179 Benign lipomatous neoplasm, unspecified: Secondary | ICD-10-CM | POA: Diagnosis not present

## 2016-12-12 DIAGNOSIS — M810 Age-related osteoporosis without current pathological fracture: Secondary | ICD-10-CM | POA: Diagnosis not present

## 2017-03-23 DIAGNOSIS — D2262 Melanocytic nevi of left upper limb, including shoulder: Secondary | ICD-10-CM | POA: Diagnosis not present

## 2017-03-23 DIAGNOSIS — L578 Other skin changes due to chronic exposure to nonionizing radiation: Secondary | ICD-10-CM | POA: Diagnosis not present

## 2017-03-23 DIAGNOSIS — L821 Other seborrheic keratosis: Secondary | ICD-10-CM | POA: Diagnosis not present

## 2017-03-23 DIAGNOSIS — L738 Other specified follicular disorders: Secondary | ICD-10-CM | POA: Diagnosis not present

## 2017-03-23 DIAGNOSIS — D485 Neoplasm of uncertain behavior of skin: Secondary | ICD-10-CM | POA: Diagnosis not present

## 2017-03-23 DIAGNOSIS — L853 Xerosis cutis: Secondary | ICD-10-CM | POA: Diagnosis not present

## 2017-03-25 ENCOUNTER — Encounter (INDEPENDENT_AMBULATORY_CARE_PROVIDER_SITE_OTHER): Payer: Self-pay | Admitting: Orthopedic Surgery

## 2017-03-25 ENCOUNTER — Ambulatory Visit (INDEPENDENT_AMBULATORY_CARE_PROVIDER_SITE_OTHER): Payer: Self-pay

## 2017-03-25 ENCOUNTER — Ambulatory Visit (INDEPENDENT_AMBULATORY_CARE_PROVIDER_SITE_OTHER): Payer: Federal, State, Local not specified - PPO | Admitting: Orthopedic Surgery

## 2017-03-25 DIAGNOSIS — M542 Cervicalgia: Secondary | ICD-10-CM | POA: Diagnosis not present

## 2017-03-25 MED ORDER — DICLOFENAC SODIUM 2 % TD SOLN: 2.0000 | Freq: Two times a day (BID) | TRANSDERMAL | 1 refills | Status: DC

## 2017-03-27 DIAGNOSIS — E7800 Pure hypercholesterolemia, unspecified: Secondary | ICD-10-CM | POA: Diagnosis not present

## 2017-03-27 DIAGNOSIS — E559 Vitamin D deficiency, unspecified: Secondary | ICD-10-CM | POA: Diagnosis not present

## 2017-03-27 DIAGNOSIS — Z0000 Encounter for general adult medical examination without abnormal findings: Secondary | ICD-10-CM | POA: Diagnosis not present

## 2017-03-28 NOTE — Progress Notes (Signed)
Office Visit Note   Patient: Cynthia Gordon           Date of Birth: 04-16-1961           MRN: [id number] Visit Date: 03/25/2017 Requested by: Merri Brunette, MD [address] [place], [place] [address] PCP: Merri Brunette, MD  Subjective: Chief Complaint  Patient presents with  . Neck - Pain  . Right Hand - Pain  . 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  States those are getting worse.  Last injections are not lasting as long.  She also reports right hip pain.  This is gotten some better but the pain comes and goes.  She has left hip replacement.  She has been tested for RA which was negative.  She is diagnosed with fibromyalgia in March.  All symptoms are worse with stress.  She's had a lot of social stressors in her life recently.  She had re-class in March for osteoporosis.  She sees a rheumatologist.  Pain is dependent on activity level.  Flexeril helps some with the pain.  She had to discontinue anti-inflammatory due to stomach irritation.  She states that on the left-hand side digits 4 and 5 are numb.  She denies any elbow pain.  Reports some weakness in that left arm              ROS: All systems reviewed are negative as they relate to the chief complaint within the history of present illness.  Patient denies  fevers or chills.   Assessment & Plan: Visit Diagnoses:  1. Cervicalgia     Plan: Impression is neck pain with possible radiculopathy affecting the left-hand side.  Bilateral thumb CMC arthritis.  She also has some right hip pain.  I would try her with some anti-inflammatory topicals for her thumbs to see if that helps.  Samples and prescription provided.  Out of work for limited period of time due to the neck and thumb issues.  MRI C-spine with follow-up with Dr. Alvester Morin after that for possible epidural steroid  injections.  Follow-Up Instructions: No Follow-up on file.   Orders:  Orders Placed This Encounter  Procedures  . XR Cervical Spine 2 or 3 views  . MR Cervical Spine w/o contrast   Meds ordered this encounter  Medications  . Diclofenac Sodium (PENNSAID) 2 % SOLN    Sig: Place 2 Squirts onto the skin 2 (two) times daily.    Dispense:  1 Bottle    Refill:  1      Procedures: No procedures performed   Clinical Data: No additional findings.  Objective: Vital Signs: There were no vitals taken for this visit.  Physical Exam:   Constitutional: Patient appears well-developed HEENT:  Head: Normocephalic Eyes:EOM are normal Neck: Normal range of motion Cardiovascular: Normal rate Pulmonary/chest: Effort normal Neurologic: Patient is alert Skin: Skin is warm Psychiatric: Patient has normal mood and affect    Ortho Exam: Orthopedic exam demonstrates some pain with flexion and extension of the neck.  She has 5 out of 5 grip EPL FPL interosseous wrist flexion and wrist extension biceps triceps and deltoid strength but some paresthesias are noted in the C7 and C8 distribution on the left compared to the right.  Negative Tinel's cubital tunnel in the elbow with no subluxation of the ulnar nerve  at this area.  No muscle atrophy in either arm.  Bilateral CMC grind test positive.  Wrist range of motion full.  Radial pulse intact.  Both hips have good range of motion and leg lengths are equal.  Specialty Comments:  No specialty comments available.  Imaging: Xr Cervical Spine 2 Or 3 Views  Result Date: 03/28/2017 AP lateral cervical spine reviewed.  Mild osteopenia is present.  Alignment is normal.  Normal lordosis is observed.  C4-5 facet arthritis is present.  Fairly minimal degenerative disc space disease in the cervical spine.    PMFS History: Patient Active Problem List   Diagnosis Date Noted  . Strain of muscle of right hip 09/14/2015  . Cellulitis and abscess of upper  arm and forearm, right 04/28/2013  . Chest pain at rest 03/03/2013  . HTN (hypertension) 03/03/2013   Past Medical History:  Diagnosis Date  . Anxiety   . Asthma   . Bronchitis    hx of  . Depression with anxiety   . Environmental allergies   . GERD (gastroesophageal reflux disease)   . History of nuclear stress test 03/08/2013   lexiscan; normal study; EF 59%  . Hx of migraines   . Hypertension   . Osteoporosis   . Seasonal allergies     Family History  Problem Relation Age of Onset  . Cancer - Lung Mother   . Cancer - Colon Father   . Healthy Brother   . Cancer - Other Maternal Grandmother 80       brain cancer from melonoma  . Cancer - Other Maternal Grandfather 80       stomach cancer  . Cancer - Lung Paternal Grandfather     Past Surgical History:  Procedure Laterality Date  . ABDOMINAL HYSTERECTOMY    . I&D EXTREMITY Right 04/28/2013   Procedure: IRRIGATION AND DEBRIDEMENT RIGHT WRIST/FOREARM;  Surgeon: Kathryne Hitchhristopher Y Blackman, MD;  [facility];  Service: Orthopedics;  Laterality: Right;  . INCISION AND DRAINAGE FOREARM / WRIST DEEP Right 04/28/2013   Dr Magnus IvanBlackman  . ROTATOR CUFF REPAIR Bilateral   . TOTAL HIP ARTHROPLASTY Left   . WRIST SURGERY Right    plate and screws in wrist   Social History   Occupational History  . Not on file.   Social History Main Topics  . Smoking status: Never Smoker  . Smokeless tobacco: Never Used  . Alcohol use Yes     Comment: social  . Drug use: No  . Sexual activity: Not on file

## 2017-03-30 ENCOUNTER — Ambulatory Visit
Admission: RE | Admit: 2017-03-30 | Discharge: 2017-03-30 | Disposition: A | Discharge status: Home / Self Care | Payer: Federal, State, Local not specified - PPO | Source: Ambulatory Visit | Attending: Orthopedic Surgery | Admitting: Orthopedic Surgery

## 2017-03-30 DIAGNOSIS — M5021 Other cervical disc displacement,  high cervical region: Secondary | ICD-10-CM | POA: Diagnosis not present

## 2017-03-30 DIAGNOSIS — M542 Cervicalgia: Secondary | ICD-10-CM

## 2017-04-01 DIAGNOSIS — R7989 Other specified abnormal findings of blood chemistry: Secondary | ICD-10-CM | POA: Diagnosis not present

## 2017-04-01 DIAGNOSIS — Z0000 Encounter for general adult medical examination without abnormal findings: Secondary | ICD-10-CM | POA: Diagnosis not present

## 2017-04-15 ENCOUNTER — Encounter (INDEPENDENT_AMBULATORY_CARE_PROVIDER_SITE_OTHER): Payer: Self-pay | Admitting: Orthopedic Surgery

## 2017-04-15 ENCOUNTER — Ambulatory Visit (INDEPENDENT_AMBULATORY_CARE_PROVIDER_SITE_OTHER): Payer: Federal, State, Local not specified - PPO | Admitting: Orthopedic Surgery

## 2017-04-15 DIAGNOSIS — M542 Cervicalgia: Secondary | ICD-10-CM | POA: Diagnosis not present

## 2017-04-15 DIAGNOSIS — R7989 Other specified abnormal findings of blood chemistry: Secondary | ICD-10-CM | POA: Diagnosis not present

## 2017-04-15 NOTE — Progress Notes (Signed)
Office Visit Note   Patient: Cynthia Gordon           Date of Birth: 03/12/1961           MRN: [id number] Visit Date: 04/15/2017 Requested by: Merri Brunette, MD [address] [place], [place] [address] PCP: Merri Brunette, MD  Subjective: Chief Complaint  Patient presents with  . Neck - Follow-up    HPI: Cynthia Gordon is a 56 year old patient with neck pain and bilateral arm pain numbness and tingling.  Since I have seen her she has had an MRI scan which is reviewed.  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  She states that when she tries to clean her house on Saturdays she can't do anything on Sunday.  She has never had injection into her neck.  She feels like there is a "huge bowling ball" on her shoulders.  She believes the neck feels compressed.  She cannot take anti-inflammatories because of new stomach ulcers.  She also describes bilateral thumb pain.  She has had injections into the [facility] joints bilaterally in the past.              ROS: All systems reviewed are negative as they relate to the chief complaint within the history of present illness.  Patient denies  fevers or chills.   Assessment & Plan: Visit Diagnoses:  1. Neck pain     Plan: Impression is neck pain with mildly progressive spondylosis in the cervical spine.  Recommend consult with Dr. Alvester Morin for cervical spine injection.  In regard to the hand numbness and tingling I looked at both elbows today.  Negative Tinel's in the cubital tunnel but she does need nerve conduction to evaluate for carpal tunnel syndrome or cubital tunnel syndrome I see her back after the studies have been done with Dr. Alvester Morin  Follow-Up Instructions: No Follow-up on file.   Orders:  Orders Placed This Encounter  Procedures  . Ambulatory referral to Physical Medicine Rehab   No orders of the defined types were placed in this encounter.     Procedures: No procedures performed   Clinical Data: No  additional findings.  Objective: Vital Signs: There were no vitals taken for this visit.  Physical Exam:   Constitutional: Patient appears well-developed HEENT:  Head: Normocephalic Eyes:EOM are normal Neck: Normal range of motion Cardiovascular: Normal rate Pulmonary/chest: Effort normal Neurologic: Patient is alert Skin: Skin is warm Psychiatric: Patient has normal mood and affect    Ortho Exam: Orthopedic exam demonstrates full cervical spine range of motion but with pain with rotation to the left and right.  Negative Tinel's cubital tunnel in the elbow and no subluxation of the ulnar nerve in either elbow.  Motor sensory function to the hand is intact.  Grind test positive bilaterally.  Wrist range of motion otherwise intact.  No other masses lymph adenopathy or skin changes noted in the wrist or shoulder region.  Specialty Comments:  No specialty comments available.  Imaging: No results found.   PMFS History: Patient Active Problem List   Diagnosis Date Noted  . Strain of muscle of right hip 09/14/2015  . Cellulitis and abscess of upper arm and forearm, right 04/28/2013  . Chest pain at rest 03/03/2013  . HTN (hypertension) 03/03/2013   Past Medical History:  Diagnosis Date  . Anxiety   . Asthma   . Bronchitis    hx of  . Depression with anxiety   . Environmental allergies   .  GERD (gastroesophageal reflux disease)   . History of nuclear stress test 03/08/2013   lexiscan; normal study; EF 59%  . Hx of migraines   . Hypertension   . Osteoporosis   . Seasonal allergies     Family History  Problem Relation Age of Onset  . Cancer - Lung Mother   . Cancer - Colon Father   . Healthy Brother   . Cancer - Other Maternal Grandmother 80       brain cancer from melonoma  . Cancer - Other Maternal Grandfather 80       stomach cancer  . Cancer - Lung Paternal Grandfather     Past Surgical History:  Procedure Laterality Date  . ABDOMINAL HYSTERECTOMY    . I&D  EXTREMITY Right 04/28/2013   Procedure: IRRIGATION AND DEBRIDEMENT RIGHT WRIST/FOREARM;  Surgeon: Kathryne Hitchhristopher Y Blackman, MD;  [facility];  Service: Orthopedics;  Laterality: Right;  . INCISION AND DRAINAGE FOREARM / WRIST DEEP Right 04/28/2013   Dr Magnus IvanBlackman  . ROTATOR CUFF REPAIR Bilateral   . TOTAL HIP ARTHROPLASTY Left   . WRIST SURGERY Right    plate and screws in wrist   Social History   Occupational History  . Not on file.   Social History Main Topics  . Smoking status: Never Smoker  . Smokeless tobacco: Never Used  . Alcohol use Yes     Comment: social  . Drug use: No  . Sexual activity: Not on file

## 2017-04-28 ENCOUNTER — Ambulatory Visit (INDEPENDENT_AMBULATORY_CARE_PROVIDER_SITE_OTHER): Payer: Federal, State, Local not specified - PPO | Admitting: Physical Medicine and Rehabilitation

## 2017-04-28 ENCOUNTER — Encounter (INDEPENDENT_AMBULATORY_CARE_PROVIDER_SITE_OTHER): Payer: Self-pay | Admitting: Physical Medicine and Rehabilitation

## 2017-04-28 DIAGNOSIS — R202 Paresthesia of skin: Secondary | ICD-10-CM | POA: Diagnosis not present

## 2017-04-28 NOTE — Progress Notes (Deleted)
Left hand dominant. Bilateral thumb pain, wrist and forearm pain to elbow. Numbness at night worse in fourth and fifth fingers, but sometimes entire hand. Numbness goes away after shaking hand.  Neck pain. Left side hurts worse. Turning head and driving cause increased pain.

## 2017-04-29 DIAGNOSIS — D485 Neoplasm of uncertain behavior of skin: Secondary | ICD-10-CM | POA: Diagnosis not present

## 2017-04-29 DIAGNOSIS — H9193 Unspecified hearing loss, bilateral: Secondary | ICD-10-CM | POA: Diagnosis not present

## 2017-04-29 DIAGNOSIS — H918X2 Other specified hearing loss, left ear: Secondary | ICD-10-CM | POA: Diagnosis not present

## 2017-04-29 DIAGNOSIS — L988 Other specified disorders of the skin and subcutaneous tissue: Secondary | ICD-10-CM | POA: Diagnosis not present

## 2017-04-30 NOTE — Progress Notes (Signed)
Cynthia Gordon - 56 y.o. female MRN [id number]  Date of birth: Mar 17, 1961  Office Visit Note: Visit Date: 04/28/2017 PCP: Cynthia Brunette, MD Referred by: Cynthia Brunette, MD  Subjective: No chief complaint on file.  HPI: Cynthia Gordon is a 56 year old female left-hand dominant with complaints of chronic worsening bilateral thumb pain with wrist and forearm pain up to the elbow particular on the left. She gets numbness fits worse at night but it's in the fourth and fifth fingers. She reports sometimes she feels like is her whole hand. She does feel like shaking her hand will make the numbness go away. She does report some neck pain chronically. She reports a left side of her neck does hurt. Turning and driving cause increased pain in her neck. She has not had prior MRI of her cervical spine. She has had surgery to the right arm and forearm. She has not had prior carpal tunnel release. Incidentally she reports a Bite to her thumb with small ulceration that won't heal. This is been going on for several weeks. It is very small and there is no induration or swelling or drainage. It is probably something she shouldn't have Dr. August Gordon look at when she sees him.    ROS Otherwise per HPI.  Assessment & Plan: Visit Diagnoses:  1. Paresthesia of skin     Plan: No additional findings.  Impression: Essentially NORMAL electrodiagnostic study of both upper limbs.  There is no significant electrodiagnostic evidence of nerve entrapment, brachial plexopathy, cervical radiculopathy or generalized peripheral neuropathy.    As you know, purely sensory or demyelinating radiculopathies and chemical radiculitis may not be detected with this particular electrodiagnostic study.  This electrodiagnostic study cannot rule out small fiber polyneuropathy and dysesthesias from central pain sensitization syndromes such as fibromyalgia.  Recommendations: 1.  Follow-up with referring physician. 2.  Continue current management  of symptoms.    Meds & Orders: No orders of the defined types were placed in this encounter.   Orders Placed This Encounter  Procedures  . NCV with EMG (electromyography)    Follow-up: Return for Dr. August Gordon.   Procedures: No procedures performed  No notes on file   Clinical History: No specialty comments available.  She reports that she has never smoked. She has never used smokeless tobacco. No results for input(s): HGBA1C, LABURIC in the last 8760 hours.  Objective:  VS:  HT:    WT:   BMI:     BP:   HR: bpm  TEMP: ( )  RESP:  Physical Exam  Musculoskeletal:  Inspection reveals right anterior forearm surgical scar and small ulceration on the outer part of the right dorsal thumb but no atrophy of the bilateral APB or FDI or hand intrinsics. There is no swelling, color changes, allodynia or dystrophic changes. There is 5 out of 5 strength in the bilateral wrist extension, finger abduction and long finger flexion. There is intact sensation to light touch in all dermatomal and peripheral nerve distributions. There is a negative Hoffmann's test bilaterally.    Ortho Exam Imaging: No results found.  Past Medical/Family/Surgical/Social History: Medications & Allergies reviewed per EMR Patient Active Problem List   Diagnosis Date Noted  . Strain of muscle of right hip 09/14/2015  . Cellulitis and abscess of upper arm and forearm, right 04/28/2013  . Chest pain at rest 03/03/2013  . HTN (hypertension) 03/03/2013   Past Medical History:  Diagnosis Date  . Anxiety   . Asthma   .  Bronchitis    hx of  . Depression with anxiety   . Environmental allergies   . GERD (gastroesophageal reflux disease)   . History of nuclear stress test 03/08/2013   lexiscan; normal study; EF 59%  . Hx of migraines   . Hypertension   . Osteoporosis   . Seasonal allergies    Family History  Problem Relation Age of Onset  . Cancer - Lung Mother   . Cancer - Colon Father   . Healthy Brother   .  Cancer - Other Maternal Grandmother 80       brain cancer from melonoma  . Cancer - Other Maternal Grandfather 80       stomach cancer  . Cancer - Lung Paternal Grandfather    Past Surgical History:  Procedure Laterality Date  . ABDOMINAL HYSTERECTOMY    . I&D EXTREMITY Right 04/28/2013   Procedure: IRRIGATION AND DEBRIDEMENT RIGHT WRIST/FOREARM;  Surgeon: Cynthia Hitch, MD;  Location: [facility];  Service: Orthopedics;  Laterality: Right;  . INCISION AND DRAINAGE FOREARM / WRIST DEEP Right 04/28/2013   Dr Cynthia Gordon  . ROTATOR CUFF REPAIR Bilateral   . TOTAL HIP ARTHROPLASTY Left   . WRIST SURGERY Right    plate and screws in wrist   Social History   Occupational History  . Not on file.   Social History Main Topics  . Smoking status: Never Smoker  . Smokeless tobacco: Never Used  . Alcohol use Yes     Comment: social  . Drug use: No  . Sexual activity: Not on file

## 2017-04-30 NOTE — Procedures (Signed)
EMG & NCV Findings: Evaluation of the left median motor nerve showed reduced amplitude (4.3 mV).  All remaining nerves (as indicated in the following tables) were within normal limits.  Left vs. Right side comparison data for the median motor nerve indicates abnormal L-R amplitude difference (55.7 %).  The ulnar sensory nerve indicates abnormal L-R latency difference (0.5 ms).  All remaining left vs. right side differences were within normal limits.    All examined muscles (as indicated in the following table) showed no evidence of electrical instability.    Impression: Essentially NORMAL electrodiagnostic study of both upper limbs.  There is no significant electrodiagnostic evidence of nerve entrapment, brachial plexopathy, cervical radiculopathy or generalized peripheral neuropathy.    As you know, purely sensory or demyelinating radiculopathies and chemical radiculitis may not be detected with this particular electrodiagnostic study.  This electrodiagnostic study cannot rule out small fiber polyneuropathy and dysesthesias from central pain sensitization syndromes such as fibromyalgia.  Recommendations: 1.  Follow-up with referring physician. 2.  Continue current management of symptoms.    Nerve Conduction Studies Anti Sensory Summary Table   Stim Site NR Peak (ms) Norm Peak (ms) P-T Amp (V) Norm P-T Amp Site1 Site2 Delta-P (ms) Dist (cm) Vel (m/s) Norm Vel (m/s)  Left Median Acr Palm Anti Sensory (2nd Digit)  32.5C  Wrist    3.5 <3.6 36.5 >10 Wrist Palm 1.7 0.0    Palm    1.8 <2.0 35.8         Right Median Acr Palm Anti Sensory (2nd Digit)  33.1C  Wrist    2.8 <3.6 41.2 >10 Wrist Palm 1.3 0.0    Palm    1.5 <2.0 38.1         Left Radial Anti Sensory (Base 1st Digit)  34.5C  Wrist    2.1 <3.1 21.1  Wrist Base 1st Digit 2.1 0.0    Right Radial Anti Sensory (Base 1st Digit)  31.6C  Wrist    2.0 <3.1 25.3  Wrist Base 1st Digit 2.0 0.0    Left Ulnar Anti Sensory (5th Digit)  33.4C   Wrist    3.5 <3.7 29.6 >15.0 Wrist 5th Digit 3.5 14.0 40 >38  Right Ulnar Anti Sensory (5th Digit)  32.8C  Wrist    3.0 <3.7 22.9 >15.0 Wrist 5th Digit 3.0 14.0 47 >38   Motor Summary Table   Stim Site NR Onset (ms) Norm Onset (ms) O-P Amp (mV) Norm O-P Amp Site1 Site2 Delta-0 (ms) Dist (cm) Vel (m/s) Norm Vel (m/s)  Left Median Motor (Abd Poll Brev)  33.1C  Wrist    3.4 <4.2 *4.3 >5 Elbow Wrist 3.2 18.0 56 >50  Elbow    6.6  2.5         Right Median Motor (Abd Poll Brev)  31.8C  Wrist    2.8 <4.2 9.7 >5 Elbow Wrist 3.1 18.0 58 >50  Elbow    5.9  10.0         Left Ulnar Motor (Abd Dig Min)  32.7C  Wrist    2.8 <4.2 9.1 >3 B Elbow Wrist 2.9 18.0 62 >53  B Elbow    5.7  9.2  A Elbow B Elbow 1.3 8.0 62 >53  A Elbow    7.0  9.3         Right Ulnar Motor (Abd Dig Min)  31.7C  Wrist    2.5 <4.2 10.7 >3 B Elbow Wrist 3.0 18.0 60 >53  B Elbow  5.5  10.6  A Elbow B Elbow 1.3 9.0 69 >53  A Elbow    6.8  10.5          EMG   Side Muscle Nerve Root Ins Act Fibs Psw Amp Dur Poly Recrt Int Dennie Bible Comment  Left Abd Poll Brev Median C8-T1 Nml Nml Nml Nml Nml 0 Nml Nml   Left 1stDorInt Ulnar C8-T1 Nml Nml Nml Nml Nml 0 Nml Nml   Left PronatorTeres Median C6-7 Nml Nml Nml Nml Nml 0 Nml Nml   Left Biceps Musculocut C5-6 Nml Nml Nml Nml Nml 0 Nml Nml   Left Deltoid Axillary C5-6 Nml Nml Nml Nml Nml 0 Nml Nml     Nerve Conduction Studies Anti Sensory Left/Right Comparison   Stim Site L Lat (ms) R Lat (ms) L-R Lat (ms) L Amp (V) R Amp (V) L-R Amp (%) Site1 Site2 L Vel (m/s) R Vel (m/s) L-R Vel (m/s)  Median Acr Palm Anti Sensory (2nd Digit)  32.5C  Wrist 3.5 2.8 0.7 36.5 41.2 11.4 Wrist Palm     Palm 1.8 1.5 0.3 35.8 38.1 6.0       Radial Anti Sensory (Base 1st Digit)  34.5C  Wrist 2.1 2.0 0.1 21.1 25.3 16.6 Wrist Base 1st Digit     Ulnar Anti Sensory (5th Digit)  33.4C  Wrist 3.5 3.0 *0.5 29.6 22.9 22.6 Wrist 5th Digit 40 47 7   Motor Left/Right Comparison   Stim Site L Lat (ms) R  Lat (ms) L-R Lat (ms) L Amp (mV) R Amp (mV) L-R Amp (%) Site1 Site2 L Vel (m/s) R Vel (m/s) L-R Vel (m/s)  Median Motor (Abd Poll Brev)  33.1C  Wrist 3.4 2.8 0.6 *4.3 9.7 *55.7 Elbow Wrist 56 58 2  Elbow 6.6 5.9 0.7 2.5 10.0 75.0       Ulnar Motor (Abd Dig Min)  32.7C  Wrist 2.8 2.5 0.3 9.1 10.7 15.0 B Elbow Wrist 62 60 2  B Elbow 5.7 5.5 0.2 9.2 10.6 13.2 A Elbow B Elbow 62 69 7  A Elbow 7.0 6.8 0.2 9.3 10.5 11.4          Waveforms:

## 2017-05-01 ENCOUNTER — Ambulatory Visit (INDEPENDENT_AMBULATORY_CARE_PROVIDER_SITE_OTHER): Payer: Federal, State, Local not specified - PPO | Admitting: Orthopedic Surgery

## 2017-05-01 ENCOUNTER — Encounter (INDEPENDENT_AMBULATORY_CARE_PROVIDER_SITE_OTHER): Payer: Self-pay | Admitting: Orthopedic Surgery

## 2017-05-01 DIAGNOSIS — W5501XA Bitten by cat, initial encounter: Secondary | ICD-10-CM

## 2017-05-01 DIAGNOSIS — S61451A Open bite of right hand, initial encounter: Secondary | ICD-10-CM | POA: Diagnosis not present

## 2017-05-01 MED ORDER — CLARITHROMYCIN 500 MG PO TABS: ORAL_TABLET | ORAL | 0 refills | Status: DC

## 2017-05-01 NOTE — Progress Notes (Signed)
Office Visit Note   Patient: Cynthia Gordon           Date of Birth: [birth year]           MRN: [id number] Visit Date: 05/01/2017 Requested by: Merri Brunette, MD [address] [place], [place] [address] PCP: Merri Brunette, MD  Subjective: Chief Complaint  Patient presents with  . Right Hand - Wound Check    HPI: Cynthia Gordon is a 56 year old patient right-hand dominant who sustained a very minimal puncture Bites to the base of the thumb 3 weeks ago.  She states is not really getting better.  She describes about an 8 mm rim of erythema surrounding the central puncture wound.  She has not taken any antibiotics until yesterday for an unrelated skin lesion excision.  She does have multiple drug allergies including amoxicillin and penicillin sulfa and tetracycline.  She's not having any fevers or chills.  She does have a history of right wrist open reduction internal fixation.  She denies any numbness or tingling in the thumb region              ROS: All systems reviewed are negative as they relate to the chief complaint within the history of present illness.  Patient denies  fevers or chills.   Assessment & Plan: Visit Diagnoses: No diagnosis found.  Plan: Impression is Right puncture wound with very focal inflammatory response and no definite purulence or significant problem other than superficial infection.  Plan is for I&D today which is done under local.  This area is opened up about 4-5 mm.  Debridements performed.  Irrigation is performed.  Did use several nitrate stick 1.  Compression dressing applied.  Come back in about a week for clinical recheck.  Also I'm going to put her on Biaxin as an antibiotic for this problem.  She should hold off on her other antibiotics.  Follow-Up Instructions: No Follow-up on file.   Orders:  No orders of the defined types were placed in this encounter.  Meds ordered this encounter  Medications  . clarithromycin (BIAXIN) 500 MG tablet   Sig: 1 PO BID X 5 DAYS    Dispense:  10 tablet    Refill:  0      Procedures: Incision & Drainage Date/Time: 05/01/2017 2:03 PM Performed by: Cammy Copa Authorized by: Cammy Copa   Consent:    Consent obtained:  Verbal   Consent given by:  Patient   Risks discussed:  Bleeding, incomplete drainage, pain, damage to other organs and infection   Alternatives discussed:  No treatment, delayed treatment, alternative treatment and observation Universal protocol:    Procedure explained and questions answered to patient or proxy's satisfaction: yes     Site/side marked: yes     Immediately prior to procedure a time out was called: yes     Patient identity confirmed:  Verbally with patient Location:    Type:  Abscess Pre-procedure details:    Skin preparation:  Alcohol and Betadine Anesthesia (see MAR for exact dosages):    Anesthesia method:  Local infiltration   Local anesthetic:  Bupivacaine 0.25% w/o epi Procedure type:    Complexity:  Simple Procedure details:    Needle aspiration: no     Incision types:  Single straight   Scalpel blade:  11   Wound management:  Irrigated with saline and debrided   Drainage:  Bloody   Drainage amount:  Moderate   Wound treatment:  Wound left  open   Packing materials:  None Post-procedure details:    Patient tolerance of procedure:  Tolerated well, no immediate complications     Clinical Data: No additional findings.  Objective: Vital Signs: There were no vitals taken for this visit.  Physical Exam:   Constitutional: Patient appears well-developed HEENT:  Head: Normocephalic Eyes:EOM are normal Neck: Normal range of motion Cardiovascular: Normal rate Pulmonary/chest: Effort normal Neurologic: Patient is alert Skin: Skin is warm Psychiatric: Patient has normal mood and affect    Ortho Exam: Orthopedic exam demonstrates about an 8 x 8 mm circular area of erythema at the base of the thumb.  There is no  streaking.  This is a very well circumscribed discrete area of erythema without underlying fluctuance.  There is a central area puncture wound which has scant serous drainage.  Thumb moves normally.  No proximal lymphadenopathy is present.  No tissue crepitus.  Specialty Comments:  No specialty comments available.  Imaging: No results found.   PMFS History: Patient Active Problem List   Diagnosis Date Noted  . Strain of muscle of right hip 09/14/2015  . Cellulitis and abscess of upper arm and forearm, right 04/28/2013  . Chest pain at rest 03/03/2013  . HTN (hypertension) 03/03/2013   Past Medical History:  Diagnosis Date  . Anxiety   . Asthma   . Bronchitis    hx of  . Depression with anxiety   . Environmental allergies   . GERD (gastroesophageal reflux disease)   . History of nuclear stress test 03/08/2013   lexiscan; normal study; EF 59%  . Hx of migraines   . Hypertension   . Osteoporosis   . Seasonal allergies     Family History  Problem Relation Age of Onset  . Cancer - Lung Mother   . Cancer - Colon Father   . Healthy Brother   . Cancer - Other Maternal Grandmother 80       brain cancer from melonoma  . Cancer - Other Maternal Grandfather 80       stomach cancer  . Cancer - Lung Paternal Grandfather     Past Surgical History:  Procedure Laterality Date  . ABDOMINAL HYSTERECTOMY    . I&D EXTREMITY Right 04/28/2013   Procedure: IRRIGATION AND DEBRIDEMENT RIGHT WRIST/FOREARM;  Surgeon: Kathryne Hitch, MD;  Location: [facility] OR;  Service: Orthopedics;  Laterality: Right;  . INCISION AND DRAINAGE FOREARM / WRIST DEEP Right 04/28/2013   Dr Magnus Ivan  . ROTATOR CUFF REPAIR Bilateral   . TOTAL HIP ARTHROPLASTY Left   . WRIST SURGERY Right    plate and screws in wrist   Social History   Occupational History  . Not on file.   Social History Main Topics  . Smoking status: Never Smoker  . Smokeless tobacco: Never Used  . Alcohol use Yes     Comment: social    . Drug use: No  . Sexual activity: Not on file

## 2017-05-04 DIAGNOSIS — H9193 Unspecified hearing loss, bilateral: Secondary | ICD-10-CM | POA: Insufficient documentation

## 2017-05-05 ENCOUNTER — Encounter (INDEPENDENT_AMBULATORY_CARE_PROVIDER_SITE_OTHER): Payer: Federal, State, Local not specified - PPO | Admitting: Physical Medicine and Rehabilitation

## 2017-05-13 IMAGING — CR DG CHEST 2V
2 series · 2 of 2 positions shown · non-contrast
Comparison: Chest radiograph dated 03/27/2016

CLINICAL DATA: 55-year-old female with chest pain.

EXAM:
CHEST  2 VIEW

[chest pa]
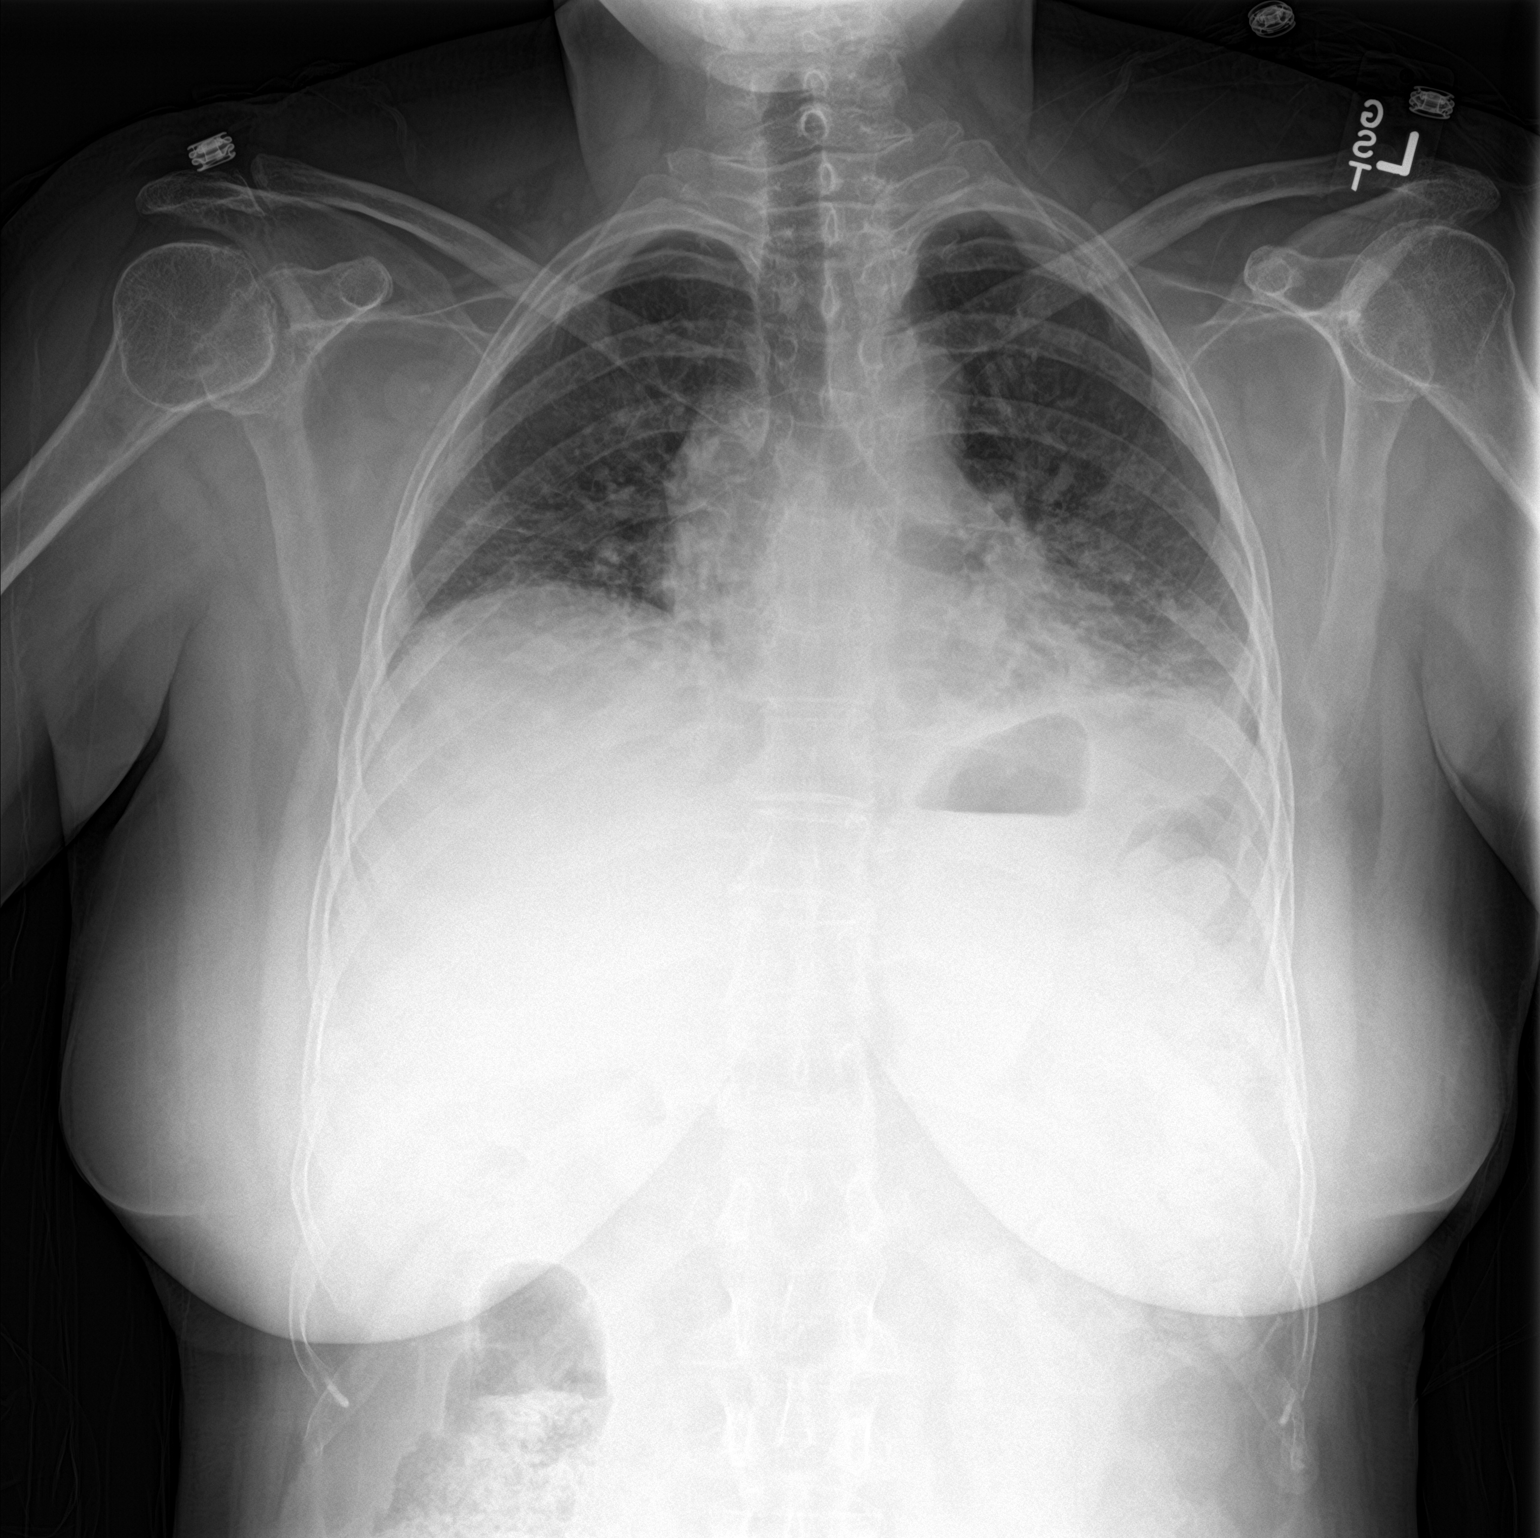

[chest lat]
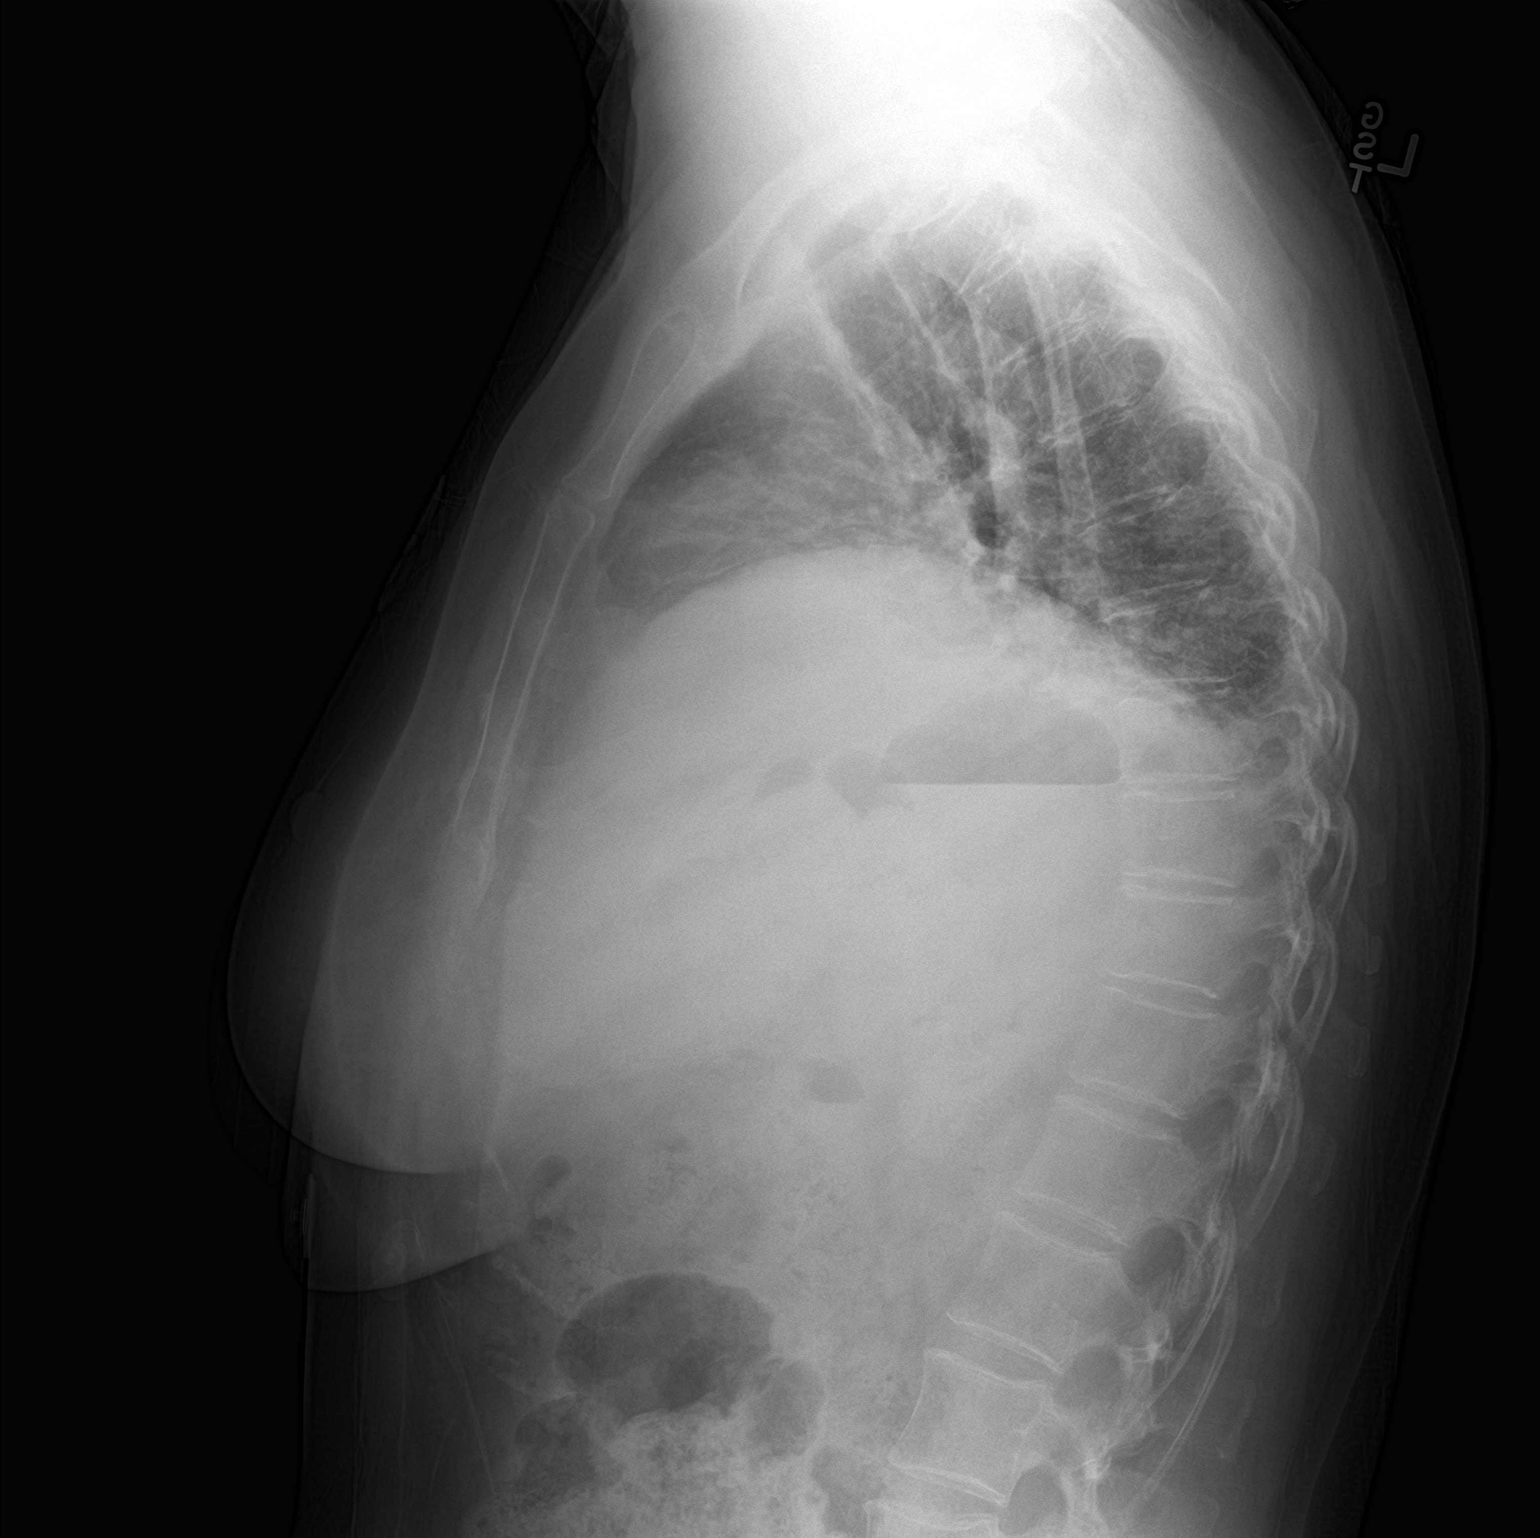

[2 of 2 positions shown; findings below may reference images not displayed]

FINDINGS: There is shallow inspiration with bibasilar atelectatic changes,
left greater right. An area of increased density in the left lung
base may be atelectatic changes or infiltrate. There is no pleural
effusion or pneumothorax. The cardiac silhouette is within normal
limits. There is osteopenia with mild degenerative changes of the
spine and right shoulder. No acute fracture.
IMPRESSION: Shallow inspiration with bibasilar atelectasis. An area of airspace
density at the left lung base may represent atelectasis or
infiltrate. Clinical correlation and follow-up recommended.

## 2017-06-01 ENCOUNTER — Other Ambulatory Visit: Payer: Self-pay | Admitting: Obstetrics and Gynecology

## 2017-06-01 DIAGNOSIS — Z1231 Encounter for screening mammogram for malignant neoplasm of breast: Secondary | ICD-10-CM

## 2017-07-15 ENCOUNTER — Ambulatory Visit
Admission: RE | Admit: 2017-07-15 | Discharge: 2017-07-15 | Disposition: A | Discharge status: Home / Self Care | Payer: Federal, State, Local not specified - PPO | Source: Ambulatory Visit | Attending: Obstetrics and Gynecology | Admitting: Obstetrics and Gynecology

## 2017-07-15 DIAGNOSIS — Z1231 Encounter for screening mammogram for malignant neoplasm of breast: Secondary | ICD-10-CM | POA: Diagnosis not present

## 2017-07-27 DIAGNOSIS — Z23 Encounter for immunization: Secondary | ICD-10-CM | POA: Diagnosis not present

## 2017-07-27 DIAGNOSIS — Z72820 Sleep deprivation: Secondary | ICD-10-CM | POA: Diagnosis not present

## 2017-07-27 DIAGNOSIS — M797 Fibromyalgia: Secondary | ICD-10-CM | POA: Diagnosis not present

## 2017-08-06 DIAGNOSIS — F5104 Psychophysiologic insomnia: Secondary | ICD-10-CM | POA: Diagnosis not present

## 2017-08-06 DIAGNOSIS — M797 Fibromyalgia: Secondary | ICD-10-CM | POA: Diagnosis not present

## 2017-09-10 DIAGNOSIS — M797 Fibromyalgia: Secondary | ICD-10-CM | POA: Diagnosis not present

## 2017-09-10 DIAGNOSIS — M545 Low back pain: Secondary | ICD-10-CM | POA: Diagnosis not present

## 2017-09-29 ENCOUNTER — Ambulatory Visit (INDEPENDENT_AMBULATORY_CARE_PROVIDER_SITE_OTHER): Payer: Federal, State, Local not specified - PPO | Admitting: Physical Medicine and Rehabilitation

## 2017-10-13 ENCOUNTER — Ambulatory Visit (INDEPENDENT_AMBULATORY_CARE_PROVIDER_SITE_OTHER): Payer: Federal, State, Local not specified - PPO | Admitting: Physical Medicine and Rehabilitation

## 2017-10-14 DIAGNOSIS — Z6826 Body mass index (BMI) 26.0-26.9, adult: Secondary | ICD-10-CM | POA: Diagnosis not present

## 2017-10-14 DIAGNOSIS — R1032 Left lower quadrant pain: Secondary | ICD-10-CM | POA: Diagnosis not present

## 2017-10-14 DIAGNOSIS — Z01419 Encounter for gynecological examination (general) (routine) without abnormal findings: Secondary | ICD-10-CM | POA: Diagnosis not present

## 2017-10-16 DIAGNOSIS — R1032 Left lower quadrant pain: Secondary | ICD-10-CM | POA: Diagnosis not present

## 2017-10-16 DIAGNOSIS — E7800 Pure hypercholesterolemia, unspecified: Secondary | ICD-10-CM | POA: Diagnosis not present

## 2017-10-20 DIAGNOSIS — J9811 Atelectasis: Secondary | ICD-10-CM | POA: Diagnosis not present

## 2017-10-20 DIAGNOSIS — R0781 Pleurodynia: Secondary | ICD-10-CM | POA: Diagnosis not present

## 2017-10-26 DIAGNOSIS — E7800 Pure hypercholesterolemia, unspecified: Secondary | ICD-10-CM | POA: Diagnosis not present

## 2017-10-26 DIAGNOSIS — M797 Fibromyalgia: Secondary | ICD-10-CM | POA: Diagnosis not present

## 2017-11-25 ENCOUNTER — Ambulatory Visit (INDEPENDENT_AMBULATORY_CARE_PROVIDER_SITE_OTHER): Payer: Federal, State, Local not specified - PPO | Admitting: Orthopedic Surgery

## 2017-11-25 ENCOUNTER — Ambulatory Visit (INDEPENDENT_AMBULATORY_CARE_PROVIDER_SITE_OTHER): Payer: Federal, State, Local not specified - PPO

## 2017-11-25 ENCOUNTER — Encounter (INDEPENDENT_AMBULATORY_CARE_PROVIDER_SITE_OTHER): Payer: Self-pay | Admitting: Orthopedic Surgery

## 2017-11-25 DIAGNOSIS — M79641 Pain in right hand: Secondary | ICD-10-CM

## 2017-11-25 DIAGNOSIS — M79642 Pain in left hand: Secondary | ICD-10-CM | POA: Diagnosis not present

## 2017-11-25 DIAGNOSIS — M1811 Unilateral primary osteoarthritis of first carpometacarpal joint, right hand: Secondary | ICD-10-CM | POA: Diagnosis not present

## 2017-11-25 DIAGNOSIS — M1812 Unilateral primary osteoarthritis of first carpometacarpal joint, left hand: Secondary | ICD-10-CM | POA: Diagnosis not present

## 2017-11-25 DIAGNOSIS — M180 Bilateral primary osteoarthritis of first carpometacarpal joints: Secondary | ICD-10-CM

## 2017-11-29 DIAGNOSIS — M1812 Unilateral primary osteoarthritis of first carpometacarpal joint, left hand: Secondary | ICD-10-CM | POA: Diagnosis not present

## 2017-11-29 DIAGNOSIS — M1811 Unilateral primary osteoarthritis of first carpometacarpal joint, right hand: Secondary | ICD-10-CM | POA: Diagnosis not present

## 2017-11-29 MED ORDER — BUPIVACAINE HCL 0.25 % IJ SOLN
0.3300 mL | INTRAMUSCULAR | Status: AC | PRN
  Administered 2017-11-29: .33 mL via INTRA_ARTICULAR

## 2017-11-29 MED ORDER — METHYLPREDNISOLONE ACETATE 40 MG/ML IJ SUSP
13.3300 mg | INTRAMUSCULAR | Status: AC | PRN
  Administered 2017-11-29: 13.33 mg via INTRA_ARTICULAR

## 2017-11-29 MED ORDER — LIDOCAINE HCL 1 % IJ SOLN
3.0000 mL | INTRAMUSCULAR | Status: AC | PRN
  Administered 2017-11-29: 3 mL

## 2017-11-29 NOTE — Progress Notes (Signed)
Office Visit Note   Patient: Cynthia Gordon           Date of Birth: 10/12/1960           MRN: [id number] Visit Date: 11/25/2017 Requested by: Merri BrunettePharr, Walter, MD [address] [place], [place] [id number] PCP: Merri BrunettePharr, Walter, MD  Subjective: Chief Complaint  Patient presents with  . Right Hand - Follow-up  . 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  She has braces but cannot work in them.  Usually her symptoms are worse with activity.  She is been dropping things and reports decreased grip strength.  Hurts for her to pinch.  She has [facility] in the past which have helped her significantly.  Each 1 is been injected about 3 times.              ROS: All systems reviewed are negative as they relate to the chief complaint within the history of present illness.  Patient denies  fevers or chills.   Assessment & Plan: Visit Diagnoses:  1. Bilateral hand pain     Plan: Impression is bilateral CMC arthritis.  Plan is referral to hand specialist [facility] and fluoroscopic injection of both CMC joints today.  Follow-up with me as needed.  Follow-Up Instructions: Return if symptoms worsen or fail to improve.   Orders:  Orders Placed This Encounter  Procedures  . XR Hand Complete Right  . XR Hand Complete Left  . Ambulatory referral to Orthopedic Surgery   No orders of the defined types were placed in this encounter.     Procedures: Small Joint Inj: bilateral thumb CMC on 11/29/2017 8:56 AM Indications: pain Details: 25 G needle, fluoroscopy-guided radial approach  Spinal Needle: No  Medications (Right): 3 mL lidocaine 1 %; 0.33 mL bupivacaine 0.25 %; 13.33 mg methylPREDNISolone acetate 40 MG/ML Medications (Left): 3 mL lidocaine 1 %; 0.33 mL bupivacaine 0.25 %; 13.33 mg methylPREDNISolone acetate 40 MG/ML Outcome: tolerated well, no immediate complications Procedure, treatment  alternatives, risks and benefits explained, specific risks discussed. Consent was given by the patient. Immediately prior to procedure a time out was called to verify the correct patient, procedure, equipment, support staff and site/side marked as required. Patient was prepped and draped in the usual sterile fashion.       Clinical Data: No additional findings.  Objective: Vital Signs: There were no vitals taken for this visit.  Physical Exam:   Constitutional: Patient appears well-developed HEENT:  Head: Normocephalic Eyes:EOM are normal Neck: Normal range of motion Cardiovascular: Normal rate Pulmonary/chest: Effort normal Neurologic: Patient is alert Skin: Skin is warm Psychiatric: Patient has normal mood and affect    Ortho Exam: Orthopedic exam demonstrates pain with pinching is along with positive grind test in both thumbs.  Wrist range of motion symmetric and intact with no swelling in the wrist joints.  Full composite finger flexion extension intact.  Radial pulse intact bilaterally.  No masses lymphadenopathy or skin changes noted in the right or left wrist region.  Well-healed surgical incision noted in the right wrist area volar.  Specialty Comments:  No specialty comments available.  Imaging: No results found.   PMFS History: Patient Active Problem List   Diagnosis Date Noted  . Strain of muscle of right hip 09/14/2015  . Cellulitis and abscess of upper arm and forearm, right 04/28/2013  . Chest pain at rest  03/03/2013  . HTN (hypertension) 03/03/2013   Past Medical History:  Diagnosis Date  . Anxiety   . Asthma   . Bronchitis    hx of  . Depression with anxiety   . Environmental allergies   . GERD (gastroesophageal reflux disease)   . History of nuclear stress test 03/08/2013   lexiscan; normal study; EF 59%  . Hx of migraines   . Hypertension   . Osteoporosis   . Seasonal allergies     Family History  Problem Relation Age of Onset  . Cancer -  Lung Mother   . Cancer - Colon Father   . Healthy Brother   . Cancer - Other Maternal Grandmother 80       brain cancer from melonoma  . Cancer - Other Maternal Grandfather 80       stomach cancer  . Cancer - Lung Paternal Grandfather     Past Surgical History:  Procedure Laterality Date  . ABDOMINAL HYSTERECTOMY    . I&D EXTREMITY Right 04/28/2013   Procedure: IRRIGATION AND DEBRIDEMENT RIGHT WRIST/FOREARM;  Surgeon: Kathryne Hitch, MD;  Location: [facility] OR;  Service: Orthopedics;  Laterality: Right;  . INCISION AND DRAINAGE FOREARM / WRIST DEEP Right 04/28/2013   Dr Magnus Ivan  . ROTATOR CUFF REPAIR Bilateral   . TOTAL HIP ARTHROPLASTY Left   . WRIST SURGERY Right    plate and screws in wrist   Social History   Occupational History  . Not on file  Tobacco Use  . Smoking status: Never Smoker  . Smokeless tobacco: Never Used  Substance and Sexual Activity  . Alcohol use: Yes    Comment: social  . Drug use: No  . Sexual activity: Not on file

## 2017-12-02 DIAGNOSIS — K5902 Outlet dysfunction constipation: Secondary | ICD-10-CM | POA: Diagnosis not present

## 2017-12-02 DIAGNOSIS — N816 Rectocele: Secondary | ICD-10-CM | POA: Diagnosis not present

## 2017-12-02 DIAGNOSIS — N393 Stress incontinence (female) (male): Secondary | ICD-10-CM | POA: Diagnosis not present

## 2017-12-02 DIAGNOSIS — K649 Unspecified hemorrhoids: Secondary | ICD-10-CM | POA: Diagnosis not present

## 2017-12-08 DIAGNOSIS — M2550 Pain in unspecified joint: Secondary | ICD-10-CM | POA: Diagnosis not present

## 2017-12-08 DIAGNOSIS — M79643 Pain in unspecified hand: Secondary | ICD-10-CM | POA: Diagnosis not present

## 2017-12-08 DIAGNOSIS — M1990 Unspecified osteoarthritis, unspecified site: Secondary | ICD-10-CM | POA: Diagnosis not present

## 2017-12-08 DIAGNOSIS — M810 Age-related osteoporosis without current pathological fracture: Secondary | ICD-10-CM | POA: Diagnosis not present

## 2018-01-01 DIAGNOSIS — M810 Age-related osteoporosis without current pathological fracture: Secondary | ICD-10-CM | POA: Diagnosis not present

## 2018-02-11 DIAGNOSIS — R0683 Snoring: Secondary | ICD-10-CM | POA: Diagnosis not present

## 2018-02-11 DIAGNOSIS — M797 Fibromyalgia: Secondary | ICD-10-CM | POA: Diagnosis not present

## 2018-02-11 DIAGNOSIS — R5383 Other fatigue: Secondary | ICD-10-CM | POA: Diagnosis not present

## 2018-02-11 DIAGNOSIS — G4719 Other hypersomnia: Secondary | ICD-10-CM | POA: Diagnosis not present

## 2018-02-19 ENCOUNTER — Other Ambulatory Visit: Payer: Self-pay

## 2018-02-22 ENCOUNTER — Ambulatory Visit: Payer: Federal, State, Local not specified - PPO | Admitting: Podiatry

## 2018-02-22 ENCOUNTER — Ambulatory Visit (INDEPENDENT_AMBULATORY_CARE_PROVIDER_SITE_OTHER): Payer: Federal, State, Local not specified - PPO

## 2018-02-22 ENCOUNTER — Encounter: Payer: Self-pay | Admitting: Podiatry

## 2018-02-22 VITALS — BP 110/76 | HR 83 | Resp 16

## 2018-02-22 DIAGNOSIS — M7751 Other enthesopathy of right foot: Secondary | ICD-10-CM

## 2018-02-22 DIAGNOSIS — M778 Other enthesopathies, not elsewhere classified: Secondary | ICD-10-CM

## 2018-02-22 DIAGNOSIS — M779 Enthesopathy, unspecified: Principal | ICD-10-CM

## 2018-02-22 DIAGNOSIS — M7752 Other enthesopathy of left foot: Secondary | ICD-10-CM

## 2018-02-22 MED ORDER — MELOXICAM 15 MG PO TABS: 15.0000 mg | ORAL_TABLET | Freq: Every day | ORAL | 1 refills | Status: AC

## 2018-02-22 MED ORDER — METHYLPREDNISOLONE 4 MG PO TBPK: ORAL_TABLET | ORAL | 0 refills | Status: DC

## 2018-02-28 NOTE — Progress Notes (Signed)
   HPI: 57 year old female presenting today as a new patient with a chief complaint of aching pain to the dorsal and plantar aspects of the right forefoot that began a few months ago. She reports associated swelling that feels like a "knot". Walking all day or wearing heels increases the pain. She has had injections in the past which helped alleviate the pain. She has done nothing for treatment. Patient is here for further evaluation and treatment.   Past Medical History:  Diagnosis Date  . Anxiety   . Asthma   . Bronchitis    hx of  . Depression with anxiety   . Environmental allergies   . GERD (gastroesophageal reflux disease)   . History of nuclear stress test 03/08/2013   lexiscan; normal study; EF 59%  . Hx of migraines   . Hypertension   . Osteoporosis   . Seasonal allergies      Physical Exam: General: The patient is alert and oriented x3 in no acute distress.  Dermatology: Skin is warm, dry and supple bilateral lower extremities. Negative for open lesions or macerations.  Vascular: Palpable pedal pulses bilaterally. No edema or erythema noted. Capillary refill within normal limits.  Neurological: Epicritic and protective threshold grossly intact bilaterally.   Musculoskeletal Exam: Pain with palpation to the the 2nd MPJ of the right foot and the left midfoot. Range of motion within normal limits to all pedal and ankle joints bilateral. Muscle strength 5/5 in all groups bilateral.   Radiographic Exam:  Normal osseous mineralization. Joint spaces preserved. No fracture/dislocation/boney destruction.    Assessment: 1. 2nd MPJ capsulitis right 2. Midfoot capsulitis left    Plan of Care:  1. Patient evaluated. X-Rays reviewed.  2. Injection of 0.5 mLs Celestone Soluspan injected into the 2nd MPJ of the right foot.  3. Prescription for Medrol Dose Pak provided to patient.  4. Prescription for Meloxicam provided to patient.  5. Offloading small met pads dispensed.  6.  Return to clinic as needed.   Works as a Radio broadcast assistant. Husband (dentist) passed away 3 years ago.      Edrick Kins, DPM [facility]  Dr. Edrick Kins, DPM    [address], [place] [address]                Office [phone]  Fax [phone]

## 2018-04-13 DIAGNOSIS — Z0000 Encounter for general adult medical examination without abnormal findings: Secondary | ICD-10-CM | POA: Diagnosis not present

## 2018-04-13 DIAGNOSIS — E7800 Pure hypercholesterolemia, unspecified: Secondary | ICD-10-CM | POA: Diagnosis not present

## 2018-04-13 DIAGNOSIS — I10 Essential (primary) hypertension: Secondary | ICD-10-CM | POA: Diagnosis not present

## 2018-05-05 DIAGNOSIS — Z23 Encounter for immunization: Secondary | ICD-10-CM | POA: Diagnosis not present

## 2018-05-05 DIAGNOSIS — R202 Paresthesia of skin: Secondary | ICD-10-CM | POA: Diagnosis not present

## 2018-05-05 DIAGNOSIS — F439 Reaction to severe stress, unspecified: Secondary | ICD-10-CM | POA: Diagnosis not present

## 2018-05-11 DIAGNOSIS — D2261 Melanocytic nevi of right upper limb, including shoulder: Secondary | ICD-10-CM | POA: Diagnosis not present

## 2018-05-11 DIAGNOSIS — D1801 Hemangioma of skin and subcutaneous tissue: Secondary | ICD-10-CM | POA: Diagnosis not present

## 2018-05-11 DIAGNOSIS — L820 Inflamed seborrheic keratosis: Secondary | ICD-10-CM | POA: Diagnosis not present

## 2018-05-11 DIAGNOSIS — L905 Scar conditions and fibrosis of skin: Secondary | ICD-10-CM | POA: Diagnosis not present

## 2018-05-11 DIAGNOSIS — L570 Actinic keratosis: Secondary | ICD-10-CM | POA: Diagnosis not present

## 2018-05-11 DIAGNOSIS — L821 Other seborrheic keratosis: Secondary | ICD-10-CM | POA: Diagnosis not present

## 2018-06-02 ENCOUNTER — Other Ambulatory Visit: Payer: Self-pay | Admitting: Obstetrics and Gynecology

## 2018-06-02 DIAGNOSIS — Z1231 Encounter for screening mammogram for malignant neoplasm of breast: Secondary | ICD-10-CM

## 2018-06-03 ENCOUNTER — Ambulatory Visit: Payer: Federal, State, Local not specified - PPO | Admitting: Neurology

## 2018-06-03 ENCOUNTER — Encounter: Payer: Self-pay | Admitting: Neurology

## 2018-06-03 ENCOUNTER — Telehealth: Payer: Self-pay | Admitting: Neurology

## 2018-06-03 VITALS — BP 129/80 | HR 103 | Ht 63.5 in | Wt 153.0 lb

## 2018-06-03 DIAGNOSIS — R2689 Other abnormalities of gait and mobility: Secondary | ICD-10-CM

## 2018-06-03 DIAGNOSIS — M79602 Pain in left arm: Secondary | ICD-10-CM

## 2018-06-03 DIAGNOSIS — G3281 Cerebellar ataxia in diseases classified elsewhere: Secondary | ICD-10-CM

## 2018-06-03 DIAGNOSIS — M79601 Pain in right arm: Secondary | ICD-10-CM

## 2018-06-03 DIAGNOSIS — R202 Paresthesia of skin: Secondary | ICD-10-CM

## 2018-06-03 DIAGNOSIS — F439 Reaction to severe stress, unspecified: Secondary | ICD-10-CM | POA: Diagnosis not present

## 2018-06-03 NOTE — Telephone Encounter (Signed)
Patient returned my call she is scheduled for 06/08/18 at [facility].  °

## 2018-06-03 NOTE — Telephone Encounter (Signed)
lvm for pt to call back about scheduling mri  [employer] auth: NPR spoke to [place] Ref # [id number]

## 2018-06-03 NOTE — Patient Instructions (Signed)
I am not quite sure how to explain your tingling sensation and burning pain throughout. Nevertheless, I would like to proceed with workup for nerve damage or what we call neuropathy. We will do an EMG and nerve conduction velocity test, which is an electrical nerve and muscle test, which we will schedule. We will call you with the results. We will check blood work today and call you with the test results. For you balance problem, I will order a brain MRI.  We will keep you posted regarding all your test results.  You had a neck MRI in July 2018 through your orthopedic surgeon.

## 2018-06-03 NOTE — Progress Notes (Signed)
Subjective:    Patient ID: Cynthia Gordon is a 57 y.o. female.  HPI     [name], MD, PhD [facility] [address] [address] [place], [place] [address]  Dear Dr. Renne Crigler,   I saw your patient, Cynthia Gordon, upon your kind request in my neurologic clinic today for initial consultation of her intermittent numbness. The patient is unaccompanied today. As you know, [name] is a 57 year old right-handed woman with an underlying medical history of hypertension, prior history of migraines, allergic rhinitis, asthma, reflux disease, depression, anxiety, irritable bowel syndrome, arthritis, overweight state, and fibromyalgia, who reports intermittent numbness and tingling affecting different parts of her body including her arms, hands and fingers. She also has some difficulty with neck turns especially to the left side. She has some symptoms in terms of tingling in her legs as well. I reviewed your office note from 04/13/2018. I reviewed blood work that she had through your office from 10/16/2017. CMP showed creatinine level of 1.1, otherwise unremarkable findings, lipid panel showed total cholesterol of 197, triglycerides 135, LDL 120. CBC with differential in July 2018 was unremarkable. TSH in July 2018 was unremarkable. She is widowed, lives with her brother and her father. She has 1 child. She is a nonsmoker and does not drink alcohol regularly, drinks caffeine in the form of coffee, 1-2 cups per day per average day. Her symptoms of paresthesias have been ongoing for a few months. She describes diffuse pain and different muscle and joint parts of her body. She has none actually had any sustained numbness. She is not diabetic. She does not have a family history of neuropathy as far she knows. She has had recent stressors. She lost her husband 3 years ago to young onset dementia. She had to move in her brother and her father. She has 1 son. She works full-time. She had  total left hip replacement about 19 years ago. She has a history of osteopenia and has been on Reclast. She has a bone density test next month. She has had recent balance problems. Thankfully, she has had no falls. She had a MRI of the cervical spine on 03/30/2017 under her orthopedic doctor and it showed mild degenerative changes, mild progression from 2014. She had EMG testing through Dr. Hatton Blas office or carpal tunnel and was told she had no evidence of carpal tunnel. Of note, she does take several potentially sedating medications but has had no recent change. She is on Xanax long-acting 1 mg once daily, Wellbutrin long-acting 300 mg once daily, Topamax 100 mg once daily in the morning and Cymbalta 60 mg once daily in the morning. She had a home sleep test recently. She tried trazodone but had side effects and stopped using it.   Her Past Medical History Is Significant For: Past Medical History:  Diagnosis Date  . Anxiety   . Asthma   . Bronchitis    hx of  . Depression with anxiety   . Environmental allergies   . GERD (gastroesophageal reflux disease)   . History of nuclear stress test 03/08/2013   lexiscan; normal study; EF 59%  . Hx of migraines   . Hypertension   . Osteoporosis   . Seasonal allergies     Her Past Surgical History Is Significant For: Past Surgical History:  Procedure Laterality Date  . ABDOMINAL HYSTERECTOMY    . I&D EXTREMITY Right 04/28/2013   Procedure: IRRIGATION AND DEBRIDEMENT RIGHT WRIST/FOREARM;  Surgeon: Vanita Panda  Magnus Ivan, MD;  Location: [facility] OR;  Service: Orthopedics;  Laterality: Right;  . INCISION AND DRAINAGE FOREARM / WRIST DEEP Right 04/28/2013   Dr Magnus Ivan  . ROTATOR CUFF REPAIR Bilateral   . TOTAL HIP ARTHROPLASTY Left   . WRIST SURGERY Right    plate and screws in wrist    Her Family History Is Significant For: Family History  Problem Relation Age of Onset  . Cancer - Lung Mother   . Cancer - Colon Father   . Healthy Brother   .  Cancer - Other Maternal Grandmother 80       brain cancer from melonoma  . Cancer - Other Maternal Grandfather 80       stomach cancer  . Cancer - Lung Paternal Grandfather     Her Social History Is Significant For: Social History   Socioeconomic History  . Marital status: Widowed    Spouse name: Not on file  . Number of children: Not on file  . Years of education: Not on file  . Highest education level: Not on file  Occupational History  . Not on file  Social Needs  . Financial resource strain: Not on file  . Food insecurity:    Worry: Not on file    Inability: Not on file  . Transportation needs:    Medical: Not on file    Non-medical: Not on file  Tobacco Use  . Smoking status: Never Smoker  . Smokeless tobacco: Never Used  Substance and Sexual Activity  . Alcohol use: Yes    Comment: social  . Drug use: No  . Sexual activity: Not on file  Lifestyle  . Physical activity:    Days per week: Not on file    Minutes per session: Not on file  . Stress: Not on file  Relationships  . Social connections:    Talks on phone: Not on file    Gets together: Not on file    Attends religious service: Not on file    Active member of club or organization: Not on file    Attends meetings of clubs or organizations: Not on file    Relationship status: Not on file  Other Topics Concern  . Not on file  Social History Narrative  . Not on file    Her Allergies Are:  Allergies  Allergen Reactions  . Amoxicillin Rash  . Codeine Itching  . Demerol [Meperidine] Itching  . Morphine And Related Itching  . Penicillin G Rash    Has patient had a PCN reaction causing immediate rash, facial/tongue/throat swelling, SOB or lightheadedness with hypotension: Yes Has patient had a PCN reaction causing severe rash involving mucus membranes or skin necrosis: Yes Has patient had a PCN reaction that required hospitalization No Has patient had a PCN reaction occurring within the last 10 years:  No If all of the above answers are "NO", then may proceed with Cephalosporin use.   Doylene Bode [Sulfamethoxazole-Trimethoprim] Rash  . Tetracyclines & Related Rash  :   Her Current Medications Are:  Outpatient Encounter Medications as of 06/03/2018  Medication Sig  . ALPRAZolam (XANAX XR) 1 MG 24 hr tablet Take 1 mg by mouth daily.  Marland Kitchen ALPRAZolam (XANAX) 0.25 MG tablet Take 0.25 mg by mouth daily as needed.  Marland Kitchen amLODipine (NORVASC) 10 MG tablet Take 10 mg by mouth daily.  Marland Kitchen atorvastatin (LIPITOR) 20 MG tablet Take 20 mg by mouth daily.  Marland Kitchen buPROPion (WELLBUTRIN XL) 300 MG 24 hr tablet  Take 300 mg by mouth daily.  . cyclobenzaprine (FLEXERIL) 10 MG tablet Take 10 mg by mouth 2 (two) times daily as needed for muscle spasms.  Marland Kitchen dexlansoprazole (DEXILANT) 60 MG capsule Take 60 mg by mouth daily.  . Diclofenac Sodium (PENNSAID) 2 % SOLN Place 2 Squirts onto the skin 2 (two) times daily.  . DULoxetine (CYMBALTA) 60 MG capsule Take 60 mg by mouth daily.  . SUMAtriptan (IMITREX) 100 MG tablet Take 100 mg by mouth every 2 (two) hours as needed for migraine.   . topiramate (TOPAMAX) 100 MG tablet Take 100 mg by mouth daily.   . valsartan-hydrochlorothiazide (DIOVAN-HCT) 320-25 MG per tablet Take 1 tablet by mouth daily.  . [DISCONTINUED] methylPREDNISolone (MEDROL DOSEPAK) 4 MG TBPK tablet 6 day dose pack - take as directed  . [DISCONTINUED] zolpidem (AMBIEN) 5 MG tablet Take 5 mg by mouth at bedtime as needed for sleep.    Facility-Administered Encounter Medications as of 06/03/2018  Medication  . ondansetron (ZOFRAN-ODT) disintegrating tablet 8 mg  : Review of Systems:  Out of a complete 14 point review of systems, all are reviewed and negative with the exception of these symptoms as listed below:  Review of Systems  Neurological:       Pt presents today to discuss her numbness. Pt has bilateral arm numbness.    Objective:  Neurological Exam  Physical Exam Physical Examination:   Vitals:    06/03/18 1011  BP: 129/80  Pulse: (!) 103    General Examination: The patient is a very pleasant 57 y.o. female in no acute distress. She appears well-developed and well-nourished and well groomed.   HEENT: Normocephalic, atraumatic, pupils are equal, round and reactive to light and accommodation. Extraocular tracking is good without limitation to gaze excursion or nystagmus noted. Normal smooth pursuit is noted. Hearing is grossly intact. Face is symmetric with normal facial animation and normal facial sensation. Speech is clear with no dysarthria noted. There is no hypophonia. There is no lip, neck/head, jaw or voice tremor. Neck is supple with full range of passive and active motion. There are no carotid bruits on auscultation. Oropharynx exam reveals: mild mouth dryness, adequate dental hygiene and mild airway crowding. Tongue protrudes centrally and palate elevates symmetrically.  Chest: Clear to auscultation without wheezing, rhonchi or crackles noted.  Heart: S1+S2+0, regular and normal without murmurs, rubs or gallops noted.   Abdomen: Soft, non-tender and non-distended with normal bowel sounds appreciated on auscultation.  Extremities: There is no pitting edema in the distal lower extremities bilaterally. Pedal pulses are intact.  Skin: Warm and dry without trophic changes noted.  Musculoskeletal: exam reveals no obvious joint deformities, tenderness or joint swelling or erythema.   Neurologically:  Mental status: The patient is awake, alert and oriented in all 4 spheres. Her immediate and remote memory, attention, language skills and fund of knowledge are appropriate. There is no evidence of aphasia, agnosia, apraxia or anomia. Speech is clear with normal prosody and enunciation. Thought process is linear. Mood is normal and affect is normal.  Cranial nerves II - XII are as described above under HEENT exam. In addition: shoulder shrug is normal with equal shoulder height  noted. Motor exam: Normal bulk, strength and tone is noted. There is no drift, tremor or rebound. Romberg is negative. Reflexes are 2+ throughout. Babinski: Toes are flexor bilaterally. Fine motor skills and coordination: intact with normal finger taps, normal hand movements, normal rapid alternating patting, normal foot taps and normal foot  agility.  Cerebellar testing: No dysmetria or intention tremor on finger to nose testing. Heel to shin is unremarkable bilaterally. There is no truncal or gait ataxia.  Sensory exam: intact sensation in the UEs and LEs. No actual numbness, no burning feet.  Gait, station and balance: She stands easily. No veering to one side is noted. No leaning to one side is noted. Posture is age-appropriate and stance is narrow based. Gait shows normal stride length and normal pace. No problems turning are noted. Tandem walk is unremarkable. Intact toe and heel stance is noted.               Assessment and Plan:   In summary, SHANIQUA GUILLOT is a very pleasant 57 y.o.-year old female with an underlying medical history of hypertension, prior history of migraines, allergic rhinitis, asthma, reflux disease, depression, anxiety, irritable bowel syndrome, arthritis, overweight state, and fibromyalgia, who presents for evaluation of her paresthesias. She also reports balance problems. Reassuringly, her exam is nonfocal, she had a recent neck MRI within the past 14 months or so. She had some degenerative changes in the mild ground. I suggested we proceed with further evaluation for possible underlying neuropathy. She is advised that her history is not telltale for neuropathy and certainly she's not diabetic. She does not give a family history of some form of hereditary neuropathy. Nevertheless, we will proceed with EMG and nerve conduction testing and additional blood work. For her balance problem we will proceed with a brain MRI with and without contrast. We will call her with her test  results to keep her posted and make a follow-up appointment accordingly if needed. We talked about the importance of maintaining a healthy lifestyle in general. I encouraged the patient to eat healthy, exercise daily and keep well hydrated, to keep a scheduled bedtime and wake time routine. I did not suggest any new medications from my end of things.  I answered all her questions today and the patient was in agreement with the above outlined plan.  Thank you very much for allowing me to participate in the care of this nice patient. If I can be of any further assistance to you please do not hesitate to call me at [phone].  Sincerely,   [name], MD, PhD\

## 2018-06-07 ENCOUNTER — Telehealth: Payer: Self-pay

## 2018-06-07 LAB — ANA W/REFLEX: Anti Nuclear Antibody(ANA): NEGATIVE

## 2018-06-07 LAB — COMPREHENSIVE METABOLIC PANEL
ALT: 31 IU/L (ref 0–32)
AST: 20 IU/L (ref 0–40)
Albumin/Globulin Ratio: 2.4 — ABNORMAL HIGH (ref 1.2–2.2)
Albumin: 5.3 g/dL (ref 3.5–5.5)
Alkaline Phosphatase: 82 IU/L (ref 39–117)
BUN/Creatinine Ratio: 13 (ref 9–23)
BUN: 14 mg/dL (ref 6–24)
Bilirubin Total: 0.3 mg/dL (ref 0.0–1.2)
CO2: 26 mmol/L (ref 20–29)
Calcium: 10.2 mg/dL (ref 8.7–10.2)
Chloride: 101 mmol/L (ref 96–106)
Creatinine, Ser: 1.08 mg/dL — ABNORMAL HIGH (ref 0.57–1.00)
GFR calc Af Amer: 66 mL/min/{1.73_m2} (ref >59)
GFR calc non Af Amer: 58 mL/min/{1.73_m2} — ABNORMAL LOW (ref >59)
Globulin, Total: 2.2 g/dL (ref 1.5–4.5)
Glucose: 89 mg/dL (ref 65–99)
Potassium: 3.8 mmol/L (ref 3.5–5.2)
Sodium: 143 mmol/L (ref 134–144)
Total Protein: 7.5 g/dL (ref 6.0–8.5)

## 2018-06-07 LAB — MULTIPLE MYELOMA PANEL, SERUM
Albumin SerPl Elph-Mcnc: 4.2 g/dL (ref 2.9–4.4)
Albumin/Glob SerPl: 1.3 (ref 0.7–1.7)
Alpha 1: 0.2 g/dL (ref 0.0–0.4)
Alpha2 Glob SerPl Elph-Mcnc: 1 g/dL (ref 0.4–1.0)
B-Globulin SerPl Elph-Mcnc: 1.1 g/dL (ref 0.7–1.3)
Gamma Glob SerPl Elph-Mcnc: 1 g/dL (ref 0.4–1.8)
Globulin, Total: 3.3 g/dL (ref 2.2–3.9)
IgA/Immunoglobulin A, Serum: 175 mg/dL (ref 87–352)
IgG (Immunoglobin G), Serum: 1007 mg/dL (ref 700–1600)
IgM (Immunoglobulin M), Srm: 66 mg/dL (ref 26–217)

## 2018-06-07 LAB — B12 AND FOLATE PANEL
Folate: >20 ng/mL (ref >3.0)
Vitamin B-12: 789 pg/mL (ref 232–1245)

## 2018-06-07 LAB — HGB A1C W/O EAG: Hgb A1c MFr Bld: 5.8 % — ABNORMAL HIGH (ref 4.8–5.6)

## 2018-06-07 LAB — VITAMIN B1: Thiamine: 168.9 nmol/L (ref 66.5–200.0)

## 2018-06-07 LAB — VITAMIN D 25 HYDROXY (VIT D DEFICIENCY, FRACTURES): Vit D, 25-Hydroxy: 44 ng/mL (ref 30.0–100.0)

## 2018-06-07 LAB — VITAMIN B6: Vitamin B6: 79.2 ug/L — ABNORMAL HIGH (ref 2.0–32.8)

## 2018-06-07 LAB — TSH: TSH: 1.44 u[IU]/mL (ref 0.450–4.500)

## 2018-06-07 LAB — RPR: RPR Ser Ql: NONREACTIVE

## 2018-06-07 NOTE — Telephone Encounter (Signed)
I called pt and explained her lab results. Pt asked that I send these results to Dr. Renne Crigler. Pt will proceed with MRI and EMG as scheduled. Pt verbalized understanding of results. Pt had no questions at this time but was encouraged to call back if questions arise.

## 2018-06-07 NOTE — Telephone Encounter (Signed)
-----   Message from [name], MD sent at 06/07/2018  1:41 PM EDT ----- Labs normal, with the exception of borderline elevated A1c, in the pre-diabetes range. Elevated vit B6 may indicate she is taking a supplement. I would recommend, she stop the vitamin B6 supplement, if she is taking some.  Proceed with brain MRI and EMG as scheduled.  Janene Harvey

## 2018-06-08 ENCOUNTER — Ambulatory Visit: Payer: Federal, State, Local not specified - PPO

## 2018-06-08 DIAGNOSIS — F439 Reaction to severe stress, unspecified: Secondary | ICD-10-CM

## 2018-06-08 DIAGNOSIS — R202 Paresthesia of skin: Secondary | ICD-10-CM

## 2018-06-08 DIAGNOSIS — R2689 Other abnormalities of gait and mobility: Secondary | ICD-10-CM

## 2018-06-08 DIAGNOSIS — M79601 Pain in right arm: Secondary | ICD-10-CM

## 2018-06-08 DIAGNOSIS — M79602 Pain in left arm: Principal | ICD-10-CM

## 2018-06-08 MED ORDER — GADOBENATE DIMEGLUMINE 529 MG/ML IV SOLN
15.0000 mL | Freq: Once | INTRAVENOUS | Status: AC | PRN
  Administered 2018-06-08: 15 mL via INTRAVENOUS

## 2018-06-09 ENCOUNTER — Encounter: Payer: Self-pay | Admitting: Neurology

## 2018-06-09 ENCOUNTER — Ambulatory Visit (INDEPENDENT_AMBULATORY_CARE_PROVIDER_SITE_OTHER): Payer: Federal, State, Local not specified - PPO | Admitting: Neurology

## 2018-06-09 ENCOUNTER — Ambulatory Visit: Payer: Federal, State, Local not specified - PPO | Admitting: Neurology

## 2018-06-09 DIAGNOSIS — M79601 Pain in right arm: Secondary | ICD-10-CM | POA: Diagnosis not present

## 2018-06-09 DIAGNOSIS — M79602 Pain in left arm: Secondary | ICD-10-CM

## 2018-06-09 DIAGNOSIS — R202 Paresthesia of skin: Secondary | ICD-10-CM

## 2018-06-09 DIAGNOSIS — R2689 Other abnormalities of gait and mobility: Secondary | ICD-10-CM

## 2018-06-09 DIAGNOSIS — F439 Reaction to severe stress, unspecified: Secondary | ICD-10-CM

## 2018-06-09 NOTE — Procedures (Signed)
     HISTORY:  Cynthia Gordon is a 57 year old patient with a history of migratory paresthesias involving all 4 extremities dating back about 3 months.  The hands and arms are affected more than the legs.  The patient is being evaluated for a possible neuropathy or a radiculopathy.  The patient does note some neck and low back pain.  NERVE CONDUCTION STUDIES:  Nerve conduction studies were performed on both upper extremities. The distal motor latencies and motor amplitudes for the median and ulnar nerves were within normal limits. The nerve conduction velocities for these nerves were also normal. The sensory latencies for the median and ulnar nerves were normal. The F wave latencies for the ulnar nerves were within normal limits.   Nerve conduction studies were performed on both lower extremities. The distal motor latencies and motor amplitudes for the peroneal and posterior tibial nerves were within normal limits. The nerve conduction velocities for these nerves were also normal. The sensory latencies for the peroneal and sural nerves were within normal limits. The F wave latencies for the posterior tibial nerves were within normal limits.   EMG STUDIES:  EMG study was performed on the left upper extremity:  The first dorsal interosseous muscle reveals 2 to 4 K units with full recruitment. No fibrillations or positive waves were noted. The abductor pollicis brevis muscle reveals 2 to 4 K units with full recruitment. No fibrillations or positive waves were noted. The extensor indicis proprius muscle reveals 1 to 3 K units with full recruitment. No fibrillations or positive waves were noted. The pronator teres muscle reveals 2 to 3 K units with full recruitment. No fibrillations or positive waves were noted. The biceps muscle reveals 1 to 2 K units with full recruitment. No fibrillations or positive waves were noted. The triceps muscle reveals 2 to 4 K units with full recruitment. No  fibrillations or positive waves were noted. The anterior deltoid muscle reveals 2 to 3 K units with full recruitment. No fibrillations or positive waves were noted. The cervical paraspinal muscles were tested at 2 levels. No abnormalities of insertional activity were seen at either level tested. There was good relaxation.   IMPRESSION:  Nerve conduction studies done on all 4 extremities were within normal limits.  No evidence of a peripheral neuropathy was seen.  EMG evaluation of the left upper extremity was unremarkable, no evidence of an overlying cervical radiculopathy was noted.  Marlan Palau MD 06/09/2018 3:36 PM  [facility] [address] [place], [place] [address]  Phone [phone] Fax [phone]

## 2018-06-09 NOTE — Progress Notes (Signed)
Please refer to EMG and nerve conduction procedure note.  

## 2018-06-09 NOTE — Progress Notes (Addendum)
Please call and advise the patient that the recent EMG and nerve conduction velocity test, which is the electrical nerve and muscle test we we performed, was reported as within normal limits. We checked for abnormal electrical discharges in the muscles or nerves and the report suggested normal findings. No further action is required on this test at this time. She can FU with PCP at this point.    [facility]    Nerve / Sites Muscle Latency Ref. Amplitude Ref. Rel Amp Segments Distance Velocity Ref. Area    ms ms mV mV %  cm m/s m/s mVms  R Median - APB     Wrist APB 2.9 ?4.4 11.0 ?4.0 100 Wrist - APB 7   42.9     Upper arm APB 6.1  10.5  96 Upper arm - Wrist 19 58 ?49 42.0  L Median - APB     Wrist APB 3.1 ?4.4 11.5 ?4.0 100 Wrist - APB 7   54.7     Upper arm APB 6.5  11.4  99.6 Upper arm - Wrist 19 55 ?49 53.0  R Ulnar - ADM     Wrist ADM 2.9 ?3.3 13.5 ?6.0 100 Wrist - ADM 7   38.7     B.Elbow ADM 5.7  12.8  94.7 B.Elbow - Wrist 18 63 ?49 38.6     A.Elbow ADM 7.4  11.9  92.7 A.Elbow - B.Elbow 10 58 ?49 37.3         A.Elbow - Wrist      L Ulnar - ADM     Wrist ADM 3.0 ?3.3 8.9 ?6.0 100 Wrist - ADM 7   27.1     B.Elbow ADM 6.1  8.2  91.8 B.Elbow - Wrist 18 57 ?49 24.7     A.Elbow ADM 8.0  8.2  99.7 A.Elbow - B.Elbow 10 55 ?49 25.4         A.Elbow - Wrist      R Peroneal - EDB     Ankle EDB 4.9 ?6.5 4.8 ?2.0 100 Ankle - EDB 9   19.3     Fib head EDB 10.6  4.6  95.8 Fib head - Ankle 29 52 ?44 19.5     Pop fossa EDB 12.7  5.0  107 Pop fossa - Fib head 10 47 ?44 21.0         Pop fossa - Ankle      L Peroneal - EDB     Ankle EDB 5.7 ?6.5 3.7 ?2.0 100 Ankle - EDB 9   14.4     Fib head EDB 11.5  3.1  83.4 Fib head - Ankle 29 50 ?44 12.2     Pop fossa EDB 13.4  2.9  93 Pop fossa - Fib head 10 51 ?44 11.4         Pop fossa - Ankle      R Tibial - AH     Ankle AH 3.9 ?5.8 10.8 ?4.0 100 Ankle - AH 9   31.6     Pop fossa AH 11.9  7.6  70.3 Pop fossa - Ankle 36 45 ?41 28.4  L Tibial - AH     Ankle AH  4.5 ?5.8 9.8 ?4.0 100 Ankle - AH 9   26.2     Pop fossa AH 11.9  6.6  68.1 Pop fossa - Ankle 36 48 ?41 20.6  SNC    Nerve / Sites Rec. Site Peak Lat Ref.  Amp Ref. Segments Distance    ms ms V V  cm  R Sural - Ankle (Calf)     Calf Ankle 4.0 ?4.4 8 ?6 Calf - Ankle 14  L Sural - Ankle (Calf)     Calf Ankle 3.9 ?4.4 8 ?6 Calf - Ankle 14  R Superficial peroneal - Ankle     Lat leg Ankle 3.8 ?4.4 7 ?6 Lat leg - Ankle 14  L Superficial peroneal - Ankle     Lat leg Ankle 3.9 ?4.4 7 ?6 Lat leg - Ankle 14  R Median - Orthodromic (Dig II, Mid palm)     Dig II Wrist 3.1 ?3.4 11 ?10 Dig II - Wrist 13  L Median - Orthodromic (Dig II, Mid palm)     Dig II Wrist 3.2 ?3.4 13 ?10 Dig II - Wrist 13  R Ulnar - Orthodromic, (Dig V, Mid palm)     Dig V Wrist 3.0 ?3.1 11 ?5 Dig V - Wrist 11  L Ulnar - Orthodromic, (Dig V, Mid palm)     Dig V Wrist 3.0 ?3.1 14 ?5 Dig V - Wrist 57                     F  Wave    Nerve F Lat Ref.   ms ms  R Ulnar - ADM 26.8 ?32.0  R Tibial - AH 45.1 ?56.0  L Tibial - AH 48.1 ?56.0  L Ulnar - ADM 26.1 ?32.0

## 2018-06-10 ENCOUNTER — Telehealth: Payer: Self-pay

## 2018-06-10 NOTE — Progress Notes (Signed)
Please call and advise the patient that the recent scan we did was within normal limits. We did a brain MRI with and wo contrast, which showed normal for age findings. She can FU with PCP.  Cynthia Gordon

## 2018-06-10 NOTE — Telephone Encounter (Signed)
-----   Message from [name], MD sent at 06/09/2018  4:41 PM EDT ----- Please call and advise the patient that the recent EMG and nerve conduction velocity test, which is the electrical nerve and muscle test we we performed, was reported as within normal limits. We checked for abnormal electrical discharges in the muscles or nerves and the report suggested normal findings. No further action is required on this test at this time. She can FU with PCP at this point.

## 2018-06-10 NOTE — Telephone Encounter (Signed)
I called pt and advised her of her NCV and MRI results being normal. Dr. Frances Furbish recommends that pt follow up with Dr. Renne Crigler. Pt verbalized understanding of results. Pt had no questions at this time but was encouraged to call back if questions arise.

## 2018-06-10 NOTE — Telephone Encounter (Signed)
-----   Message from [name], MD sent at 06/10/2018  8:43 AM EDT ----- Please call and advise the patient that the recent scan we did was within normal limits. We did a brain MRI with and wo contrast, which showed normal for age findings. She can FU with PCP.  Cynthia Gordon

## 2018-06-22 DIAGNOSIS — I10 Essential (primary) hypertension: Secondary | ICD-10-CM | POA: Diagnosis not present

## 2018-06-22 DIAGNOSIS — R000 Tachycardia, unspecified: Secondary | ICD-10-CM | POA: Diagnosis not present

## 2018-06-28 ENCOUNTER — Ambulatory Visit (INDEPENDENT_AMBULATORY_CARE_PROVIDER_SITE_OTHER): Payer: Federal, State, Local not specified - PPO | Admitting: Orthopedic Surgery

## 2018-07-05 DIAGNOSIS — R002 Palpitations: Secondary | ICD-10-CM | POA: Diagnosis not present

## 2018-07-07 ENCOUNTER — Encounter (INDEPENDENT_AMBULATORY_CARE_PROVIDER_SITE_OTHER): Payer: Self-pay | Admitting: Orthopedic Surgery

## 2018-07-07 ENCOUNTER — Ambulatory Visit (INDEPENDENT_AMBULATORY_CARE_PROVIDER_SITE_OTHER): Payer: Self-pay

## 2018-07-07 ENCOUNTER — Ambulatory Visit (INDEPENDENT_AMBULATORY_CARE_PROVIDER_SITE_OTHER): Payer: Federal, State, Local not specified - PPO | Admitting: Orthopedic Surgery

## 2018-07-07 DIAGNOSIS — M79641 Pain in right hand: Secondary | ICD-10-CM

## 2018-07-07 DIAGNOSIS — M79642 Pain in left hand: Secondary | ICD-10-CM

## 2018-07-07 DIAGNOSIS — M180 Bilateral primary osteoarthritis of first carpometacarpal joints: Secondary | ICD-10-CM | POA: Diagnosis not present

## 2018-07-07 NOTE — Progress Notes (Signed)
X carm

## 2018-07-09 ENCOUNTER — Encounter (INDEPENDENT_AMBULATORY_CARE_PROVIDER_SITE_OTHER): Payer: Self-pay | Admitting: Orthopedic Surgery

## 2018-07-09 DIAGNOSIS — M180 Bilateral primary osteoarthritis of first carpometacarpal joints: Secondary | ICD-10-CM

## 2018-07-09 MED ORDER — LIDOCAINE HCL 1 % IJ SOLN
3.0000 mL | INTRAMUSCULAR | Status: AC | PRN
  Administered 2018-07-09: 3 mL

## 2018-07-09 MED ORDER — METHYLPREDNISOLONE ACETATE 40 MG/ML IJ SUSP
13.3300 mg | INTRAMUSCULAR | Status: AC | PRN
  Administered 2018-07-09: 13.33 mg via INTRA_ARTICULAR

## 2018-07-09 MED ORDER — BUPIVACAINE HCL 0.5 % IJ SOLN
0.3300 mL | INTRAMUSCULAR | Status: AC | PRN
  Administered 2018-07-09: .33 mL via INTRA_ARTICULAR

## 2018-07-09 NOTE — Progress Notes (Signed)
Office Visit Note   Patient: Cynthia Gordon           Date of Birth: [birth year]           MRN: [id number] Visit Date: 07/07/2018 Requested by: Merri Brunette, MD [address] [place], [place] [address] PCP: Merri Brunette, MD  Subjective: Chief Complaint  Patient presents with  . Left Hand - Pain  . Right Hand - Pain    HPI: [name] is a patient with bilateral hand pain as well as some neck pain and radicular symptoms.  She has known history of CMC arthritis.  She describes rest pain bilaterally with diminished pinch and grip strength.  She has seen a neurologist who is imaged her brain and she was fine that regard.  She is on Ambien for sleep.              ROS: All systems reviewed are negative as they relate to the chief complaint within the history of present illness.  Patient denies  fevers or chills.   Assessment & Plan: Visit Diagnoses:  1. Bilateral hand pain     Plan: Impression is CMC arthritis and possible cervical radiculopathy.  Plan is bilateral CMC injections performed today with fluoroscopic guidance.  She is MRI of her C-spine to evaluate bilateral radiculopathy and numbness.  I think that she would also benefit from surgical referral to the hand surgeon for operative indication and treatment.  I referred her before and she did not go but I think she is ready to go this point after 9 years of the injections.  Follow-Up Instructions: Return for after MRI.   Orders:  Orders Placed This Encounter  Procedures  . MR Cervical Spine w/o contrast  . XR C-ARM NO REPORT   No orders of the defined types were placed in this encounter.     Procedures: Small Joint Inj: bilateral thumb MCP on 07/09/2018 8:12 PM Indications: pain Details: 25 G needle, fluoroscopy-guided radial approach  Spinal Needle: No  Medications (Right): 3 mL lidocaine 1 %; 13.33 mg methylPREDNISolone acetate 40 MG/ML; 0.33 mL bupivacaine 0.5 % Medications (Left): 3 mL lidocaine 1 %;  13.33 mg methylPREDNISolone acetate 40 MG/ML; 0.33 mL bupivacaine 0.5 % Outcome: tolerated well, no immediate complications Procedure, treatment alternatives, risks and benefits explained, specific risks discussed. Consent was given by the patient. Immediately prior to procedure a time out was called to verify the correct patient, procedure, equipment, support staff and site/side marked as required. Patient was prepped and draped in the usual sterile fashion.       Clinical Data: No additional findings.  Objective: Vital Signs: There were no vitals taken for this visit.  Physical Exam:   Constitutional: Patient appears well-developed HEENT:  Head: Normocephalic Eyes:EOM are normal Neck: Normal range of motion Cardiovascular: Normal rate Pulmonary/chest: Effort normal Neurologic: Patient is alert Skin: Skin is warm Psychiatric: Patient has normal mood and affect    Ortho Exam: Ortho exam demonstrates positive grind test bilateral thumbs.  Diminished pinch strength is present.  Wrist range of motion is full.  No snuffbox is present in either wrist.  No radial styloid tenderness is present.  Negative Tinel's cubital tunnel.  Cervical spine range of motion demonstrates some pain with Tatian.  Flexion extension is intact.  No definite paresthesias C5-T1 with present.  No muscle atrophy in the arms.  Specialty Comments:  No specialty comments available.  Imaging: No results found.   PMFS History: Patient Active Problem  List   Diagnosis Date Noted  . Bilateral hearing loss 05/04/2017  . Strain of muscle of right hip 09/14/2015  . Rectocele 08/01/2015  . 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 Avascular necrosis (HCC) 07/11/2015  . Cellulitis and abscess of upper arm and forearm, right 04/28/2013  . Chest pain at rest 03/03/2013  . HTN (hypertension) 03/03/2013   Past Medical History:  Diagnosis Date  . Anxiety   . Asthma   . Bronchitis    hx of  .  Depression with anxiety   . Environmental allergies   . GERD (gastroesophageal reflux disease)   . History of nuclear stress test 03/08/2013   lexiscan; normal study; EF 59%  . Hx of migraines   . Hypertension   . Osteoporosis   . Seasonal allergies     Family History  Problem Relation Age of Onset  . Cancer - Lung Mother   . Cancer - Colon Father   . Healthy Brother   . Cancer - Other Maternal Grandmother 80       brain cancer from melonoma  . Cancer - Other Maternal Grandfather 80       stomach cancer  . Cancer - Lung Paternal Grandfather     Past Surgical History:  Procedure Laterality Date  . ABDOMINAL HYSTERECTOMY    . I&D EXTREMITY Right 04/28/2013   Procedure: IRRIGATION AND DEBRIDEMENT RIGHT WRIST/FOREARM;  Surgeon: Kathryne Hitch, MD;  Location: [facility] OR;  Service: Orthopedics;  Laterality: Right;  . INCISION AND DRAINAGE FOREARM / WRIST DEEP Right 04/28/2013   Dr Magnus Ivan  . ROTATOR CUFF REPAIR Bilateral   . TOTAL HIP ARTHROPLASTY Left   . WRIST SURGERY Right    plate and screws in wrist   Social History   Occupational History  . Not on file  Tobacco Use  . Smoking status: Never Smoker  . Smokeless tobacco: Never Used  Substance and Sexual Activity  . Alcohol use: Yes    Comment: social  . Drug use: No  . Sexual activity: Not on file

## 2018-07-15 ENCOUNTER — Telehealth (INDEPENDENT_AMBULATORY_CARE_PROVIDER_SITE_OTHER): Payer: Self-pay | Admitting: *Deleted

## 2018-07-15 DIAGNOSIS — M19049 Primary osteoarthritis, unspecified hand: Secondary | ICD-10-CM

## 2018-07-15 NOTE — Telephone Encounter (Signed)
Pls rf to [facility] thx also needs c spine mri

## 2018-07-15 NOTE — Telephone Encounter (Signed)
Please advise who you want to refer her to? We also made referral for cervical MRI.

## 2018-07-15 NOTE — Telephone Encounter (Signed)
Pt called stating she was in about a week ago and got injections in her bilateral thumbs and says you had mentioned getting her referred to a hand surgeon, pt states she is ready to proceed with that referral. Please advise.

## 2018-07-16 NOTE — Telephone Encounter (Signed)
I put order in for this. He wanted to refer her to Dr Mina Marble.

## 2018-07-19 ENCOUNTER — Ambulatory Visit: Payer: Federal, State, Local not specified - PPO

## 2018-07-19 DIAGNOSIS — Z13820 Encounter for screening for osteoporosis: Secondary | ICD-10-CM | POA: Diagnosis not present

## 2018-07-19 NOTE — Telephone Encounter (Signed)
Faxed [facility] to [facility] with [facility].

## 2018-07-20 ENCOUNTER — Ambulatory Visit
Admission: RE | Admit: 2018-07-20 | Discharge: 2018-07-20 | Disposition: A | Discharge status: Home / Self Care | Payer: Federal, State, Local not specified - PPO | Source: Ambulatory Visit | Attending: Obstetrics and Gynecology | Admitting: Obstetrics and Gynecology

## 2018-07-20 DIAGNOSIS — Z1231 Encounter for screening mammogram for malignant neoplasm of breast: Secondary | ICD-10-CM

## 2018-07-21 ENCOUNTER — Ambulatory Visit
Admission: RE | Admit: 2018-07-21 | Discharge: 2018-07-21 | Disposition: A | Discharge status: Home / Self Care | Payer: Federal, State, Local not specified - PPO | Source: Ambulatory Visit | Attending: Orthopedic Surgery | Admitting: Orthopedic Surgery

## 2018-07-21 DIAGNOSIS — M542 Cervicalgia: Secondary | ICD-10-CM | POA: Diagnosis not present

## 2018-07-21 DIAGNOSIS — M79642 Pain in left hand: Secondary | ICD-10-CM

## 2018-07-21 DIAGNOSIS — M79641 Pain in right hand: Secondary | ICD-10-CM

## 2018-07-27 DIAGNOSIS — M180 Bilateral primary osteoarthritis of first carpometacarpal joints: Secondary | ICD-10-CM | POA: Diagnosis not present

## 2018-07-27 DIAGNOSIS — M654 Radial styloid tenosynovitis [de Quervain]: Secondary | ICD-10-CM | POA: Diagnosis not present

## 2018-07-28 DIAGNOSIS — M1811 Unilateral primary osteoarthritis of first carpometacarpal joint, right hand: Secondary | ICD-10-CM | POA: Diagnosis not present

## 2018-07-30 DIAGNOSIS — M654 Radial styloid tenosynovitis [de Quervain]: Secondary | ICD-10-CM | POA: Diagnosis not present

## 2018-07-30 DIAGNOSIS — G8918 Other acute postprocedural pain: Secondary | ICD-10-CM | POA: Diagnosis not present

## 2018-07-30 DIAGNOSIS — M1811 Unilateral primary osteoarthritis of first carpometacarpal joint, right hand: Secondary | ICD-10-CM | POA: Diagnosis not present

## 2018-08-03 DIAGNOSIS — M25641 Stiffness of right hand, not elsewhere classified: Secondary | ICD-10-CM | POA: Diagnosis not present

## 2018-08-03 DIAGNOSIS — M180 Bilateral primary osteoarthritis of first carpometacarpal joints: Secondary | ICD-10-CM | POA: Diagnosis not present

## 2018-08-03 DIAGNOSIS — M25541 Pain in joints of right hand: Secondary | ICD-10-CM | POA: Diagnosis not present

## 2018-08-03 DIAGNOSIS — M654 Radial styloid tenosynovitis [de Quervain]: Secondary | ICD-10-CM | POA: Diagnosis not present

## 2018-08-23 DIAGNOSIS — I952 Hypotension due to drugs: Secondary | ICD-10-CM | POA: Diagnosis not present

## 2018-08-23 DIAGNOSIS — I10 Essential (primary) hypertension: Secondary | ICD-10-CM | POA: Diagnosis not present

## 2018-08-30 DIAGNOSIS — I10 Essential (primary) hypertension: Secondary | ICD-10-CM | POA: Diagnosis not present

## 2018-08-31 DIAGNOSIS — M654 Radial styloid tenosynovitis [de Quervain]: Secondary | ICD-10-CM | POA: Diagnosis not present

## 2018-08-31 DIAGNOSIS — M25641 Stiffness of right hand, not elsewhere classified: Secondary | ICD-10-CM | POA: Diagnosis not present

## 2018-08-31 DIAGNOSIS — M180 Bilateral primary osteoarthritis of first carpometacarpal joints: Secondary | ICD-10-CM | POA: Diagnosis not present

## 2018-08-31 DIAGNOSIS — M25541 Pain in joints of right hand: Secondary | ICD-10-CM | POA: Diagnosis not present

## 2018-09-06 DIAGNOSIS — M654 Radial styloid tenosynovitis [de Quervain]: Secondary | ICD-10-CM | POA: Diagnosis not present

## 2018-09-06 DIAGNOSIS — M25532 Pain in left wrist: Secondary | ICD-10-CM | POA: Diagnosis not present

## 2018-09-06 DIAGNOSIS — M180 Bilateral primary osteoarthritis of first carpometacarpal joints: Secondary | ICD-10-CM | POA: Diagnosis not present

## 2018-09-06 DIAGNOSIS — G8918 Other acute postprocedural pain: Secondary | ICD-10-CM | POA: Diagnosis not present

## 2018-09-09 DIAGNOSIS — M25542 Pain in joints of left hand: Secondary | ICD-10-CM | POA: Diagnosis not present

## 2018-09-09 DIAGNOSIS — M25642 Stiffness of left hand, not elsewhere classified: Secondary | ICD-10-CM | POA: Diagnosis not present

## 2018-09-09 DIAGNOSIS — M180 Bilateral primary osteoarthritis of first carpometacarpal joints: Secondary | ICD-10-CM | POA: Diagnosis not present

## 2018-09-21 DIAGNOSIS — E7800 Pure hypercholesterolemia, unspecified: Secondary | ICD-10-CM | POA: Diagnosis not present

## 2018-09-21 DIAGNOSIS — I10 Essential (primary) hypertension: Secondary | ICD-10-CM | POA: Diagnosis not present

## 2018-09-22 DIAGNOSIS — M25642 Stiffness of left hand, not elsewhere classified: Secondary | ICD-10-CM | POA: Diagnosis not present

## 2018-09-22 DIAGNOSIS — M180 Bilateral primary osteoarthritis of first carpometacarpal joints: Secondary | ICD-10-CM | POA: Diagnosis not present

## 2018-09-22 DIAGNOSIS — M25641 Stiffness of right hand, not elsewhere classified: Secondary | ICD-10-CM | POA: Diagnosis not present

## 2018-09-22 DIAGNOSIS — M25542 Pain in joints of left hand: Secondary | ICD-10-CM | POA: Diagnosis not present

## 2018-09-30 DIAGNOSIS — M25542 Pain in joints of left hand: Secondary | ICD-10-CM | POA: Diagnosis not present

## 2018-09-30 DIAGNOSIS — E7800 Pure hypercholesterolemia, unspecified: Secondary | ICD-10-CM | POA: Diagnosis not present

## 2018-09-30 DIAGNOSIS — M25631 Stiffness of right wrist, not elsewhere classified: Secondary | ICD-10-CM | POA: Diagnosis not present

## 2018-09-30 DIAGNOSIS — M25641 Stiffness of right hand, not elsewhere classified: Secondary | ICD-10-CM | POA: Diagnosis not present

## 2018-09-30 DIAGNOSIS — M25642 Stiffness of left hand, not elsewhere classified: Secondary | ICD-10-CM | POA: Diagnosis not present

## 2018-10-04 ENCOUNTER — Ambulatory Visit (INDEPENDENT_AMBULATORY_CARE_PROVIDER_SITE_OTHER): Payer: Federal, State, Local not specified - PPO | Admitting: Orthopedic Surgery

## 2018-10-13 ENCOUNTER — Telehealth (INDEPENDENT_AMBULATORY_CARE_PROVIDER_SITE_OTHER): Payer: Self-pay | Admitting: Orthopedic Surgery

## 2018-10-13 ENCOUNTER — Ambulatory Visit (INDEPENDENT_AMBULATORY_CARE_PROVIDER_SITE_OTHER): Payer: Federal, State, Local not specified - PPO | Admitting: Orthopedic Surgery

## 2018-10-13 NOTE — Telephone Encounter (Signed)
Patient called canceled her appointment today because she is sick. Patient asked if she can be called with the results of her MRI. The number to contact patient is [phone]

## 2018-10-13 NOTE — Telephone Encounter (Signed)
Message has already been sent to Dr. August Saucer

## 2018-10-13 NOTE — Telephone Encounter (Signed)
Pleased advise.  

## 2018-10-13 NOTE — Telephone Encounter (Signed)
Patient called left Vm needed to cancel her appointment due to being sick. She stated she is not sure when she will be able to make into the office, would like Dr.Dean to review her results over the phone if possible.

## 2018-10-13 NOTE — Telephone Encounter (Signed)
lmom 

## 2018-10-19 DIAGNOSIS — Z01419 Encounter for gynecological examination (general) (routine) without abnormal findings: Secondary | ICD-10-CM | POA: Diagnosis not present

## 2018-10-19 DIAGNOSIS — Z01411 Encounter for gynecological examination (general) (routine) with abnormal findings: Secondary | ICD-10-CM | POA: Diagnosis not present

## 2018-10-19 DIAGNOSIS — N819 Female genital prolapse, unspecified: Secondary | ICD-10-CM | POA: Diagnosis not present

## 2018-10-19 DIAGNOSIS — N952 Postmenopausal atrophic vaginitis: Secondary | ICD-10-CM | POA: Diagnosis not present

## 2018-10-19 DIAGNOSIS — F419 Anxiety disorder, unspecified: Secondary | ICD-10-CM | POA: Diagnosis not present

## 2018-10-28 ENCOUNTER — Encounter (INDEPENDENT_AMBULATORY_CARE_PROVIDER_SITE_OTHER): Payer: Self-pay | Admitting: Orthopedic Surgery

## 2018-10-28 ENCOUNTER — Ambulatory Visit (INDEPENDENT_AMBULATORY_CARE_PROVIDER_SITE_OTHER): Payer: Federal, State, Local not specified - PPO | Admitting: Orthopedic Surgery

## 2018-10-28 DIAGNOSIS — M542 Cervicalgia: Secondary | ICD-10-CM | POA: Diagnosis not present

## 2018-10-28 DIAGNOSIS — M19049 Primary osteoarthritis, unspecified hand: Secondary | ICD-10-CM | POA: Diagnosis not present

## 2018-10-28 NOTE — Progress Notes (Signed)
Office Visit Note   Patient: Cynthia Gordon           Date of Birth: 09/10/1960           MRN: [id number] Visit Date: 10/28/2018 Requested by: Merri Brunette, MD [address] [place], [place] [address] PCP: Merri Brunette, MD  Subjective: Chief Complaint  Patient presents with  . Neck - Follow-up  . Left Hip - Follow-up  . Lower Back - Follow-up    HPI: Cynthia Gordon comes in today with multiple orthopedic complaints.  She is currently filing for so security disability.  She is a Cytogeneticist.  She has had multiple issues in the past.  She is had bilateral rotator cuff surgery and she is done reasonably well from those procedures.  Still has a little bit of tightness in that right shoulder.  Patient also reports neck arthritis.  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  She is not had injections in the cervical spine.  She does report neck tightness particularly with her employment.  Patient also reports back pain.  MRI scan 2017 shows lumbar spondylosis and degenerative disc disease with borderline impingement at L4-5 and L5-S1.  She also has multilevel facet arthropathy especially at L4-5.  Last epidural steroid injection and facet injection in the lumbar spine was 2016.  Patient also reports bilateral hand pain.  She has a long history of CMC arthritis and has had multiple injections in the [facility] joints over the past 4 years.  She recently had bilateral CMC arthroplasty performed at the end of 2019.  She is recovering from that surgery.  Patient also reports left hip pain.  She had left hip fracture treated initially with revascularized fibular grafting and then with total hip replacement approximately 19 years ago.  She does report occasional popping in that left hip.  That has been worked up here with fluoroscopy as well as with referral to another provider at [facility] and no  discernible cause was found.  She does report popping with rotation which occurs at times in that left hip.  Patient also has a history of osteoporosis which requires her to take Reclast.              ROS: All systems reviewed are negative as they relate to the chief complaint within the history of present illness.  Patient denies  fevers or chills.   Assessment & Plan: Visit Diagnoses:  1. CMC arthritis   2. Cervicalgia     Plan: Impression is multiple orthopedic issues with history of multiple surgeries on the shoulder hands and left hip with degenerative changes in the neck and back.  Patient has current disability mostly related to her hands.  I think in general her current employment is difficult based on the degenerative changes in her axial spine as well as her bilateral hand and shoulder surgeries.  At this time there is no current operative indication for any of her orthopedic problems.  I will see her back as needed.  We talked about some of the forms to be filled out today regarding VA disability but after further review it is not indicated to fill those forms out. Follow-Up Instructions: Return if symptoms worsen or fail to improve.   Orders:  No orders of the defined types were placed in this encounter.  No orders of the defined types were placed in this encounter.  Procedures: No procedures performed   Clinical Data: No additional findings.  Objective: Vital Signs: There were no vitals taken for this visit.  Physical Exam:   Constitutional: Patient appears well-developed HEENT:  Head: Normocephalic Eyes:EOM are normal Neck: Normal range of motion Cardiovascular: Normal rate Pulmonary/chest: Effort normal Neurologic: Patient is alert Skin: Skin is warm Psychiatric: Patient has normal mood and affect    Ortho Exam: Ortho exam demonstrates both hands have had surgery on the [facility] joint.  They have predictably diminished strength and range of motion in both  the fingers and wrist on both hands.  She also has good range of motion of the elbows and shoulders.  Reasonable rotator cuff strength in both shoulders without grinding or crepitus.  In the hips she has some weakness with hip flexion on the left and to about 5- out of 5 compared to the right.  Not much pain with internal extra rotation of either hip.  Pelvis is level with standing.  She has good ankle dorsiflexion plantarflexion strength.  Does have some shooting paresthesias in that left arm likely related to surgery.  Neck range of motion is about 40 of extension flexion chin to chest and rotation is about 50 degrees bilaterally  Specialty Comments:  No specialty comments available.  Imaging: No results found.   PMFS History: Patient Active Problem List   Diagnosis Date Noted  . Bilateral hearing loss 05/04/2017  . Strain of muscle of right hip 09/14/2015  . Rectocele 08/01/2015  . 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 Avascular necrosis (HCC) 07/11/2015  . Cellulitis and abscess of upper arm and forearm, right 04/28/2013  . Chest pain at rest 03/03/2013  . HTN (hypertension) 03/03/2013   Past Medical History:  Diagnosis Date  . Anxiety   . Asthma   . Bronchitis    hx of  . Depression with anxiety   . Environmental allergies   . GERD (gastroesophageal reflux disease)   . History of nuclear stress test 03/08/2013   lexiscan; normal study; EF 59%  . Hx of migraines   . Hypertension   . Osteoporosis   . Seasonal allergies     Family History  Problem Relation Age of Onset  . Cancer - Lung Mother   . Cancer - Colon Father   . Healthy Brother   . Cancer - Other Maternal Grandmother 80       brain cancer from melonoma  . Cancer - Other Maternal Grandfather 80       stomach cancer  . Cancer - Lung Paternal Grandfather     Past Surgical History:  Procedure Laterality Date  . ABDOMINAL HYSTERECTOMY    . I&D EXTREMITY Right 04/28/2013   Procedure: IRRIGATION  AND DEBRIDEMENT RIGHT WRIST/FOREARM;  Surgeon: Kathryne Hitch, MD;  Location[facility] OR;  Service: Orthopedics;  Laterality: Right;  . INCISION AND DRAINAGE FOREARM / WRIST DEEP Right 04/28/2013   Dr Magnus Ivan  . ROTATOR CUFF REPAIR Bilateral   . TOTAL HIP ARTHROPLASTY Left   . WRIST SURGERY Right    plate and screws in wrist   Social History   Occupational History  . Not on file  Tobacco Use  . Smoking status: Never Smoker  . Smokeless tobacco: Never Used  Substance and Sexual Activity  . Alcohol use: Yes    Comment: social  . Drug use: No  . Sexual activity: Not on file

## 2018-10-29 ENCOUNTER — Telehealth (INDEPENDENT_AMBULATORY_CARE_PROVIDER_SITE_OTHER): Payer: Self-pay | Admitting: Orthopedic Surgery

## 2018-10-29 NOTE — Telephone Encounter (Signed)
Patient left VA papers to be signed by Dr August Saucer for Disability. Patient would like to pick those up when they are ready. She does not want them mailed.  Please call patient at [phone]

## 2018-10-29 NOTE — Telephone Encounter (Signed)
Please advise. I thought that you stated we were not filling these out for patient? They have been discarded.

## 2018-10-29 NOTE — Telephone Encounter (Signed)
The papers are not here anymore.  She will likely have reduced disability on her hip based on her current exam and she is not really service-connected for her back and it would be essentially impossible to connect her hip to her back especially with the way her back looks which is mild degenerative changes.

## 2018-10-29 NOTE — Telephone Encounter (Signed)
IC s/w patient and advised. She verbalized understanding.  

## 2018-11-05 ENCOUNTER — Encounter: Payer: Self-pay | Admitting: Neurology

## 2018-11-08 ENCOUNTER — Other Ambulatory Visit: Payer: Self-pay | Admitting: *Deleted

## 2018-11-08 DIAGNOSIS — M654 Radial styloid tenosynovitis [de Quervain]: Secondary | ICD-10-CM

## 2018-11-08 DIAGNOSIS — M180 Bilateral primary osteoarthritis of first carpometacarpal joints: Secondary | ICD-10-CM

## 2018-11-08 DIAGNOSIS — G5622 Lesion of ulnar nerve, left upper limb: Secondary | ICD-10-CM

## 2018-11-16 ENCOUNTER — Ambulatory Visit (INDEPENDENT_AMBULATORY_CARE_PROVIDER_SITE_OTHER): Payer: Federal, State, Local not specified - PPO | Admitting: Neurology

## 2018-11-16 DIAGNOSIS — M180 Bilateral primary osteoarthritis of first carpometacarpal joints: Secondary | ICD-10-CM | POA: Diagnosis not present

## 2018-11-16 DIAGNOSIS — M654 Radial styloid tenosynovitis [de Quervain]: Secondary | ICD-10-CM

## 2018-11-16 DIAGNOSIS — G5622 Lesion of ulnar nerve, left upper limb: Secondary | ICD-10-CM

## 2018-11-16 NOTE — Procedures (Signed)
[facility]  [address], [address]  [place], [place] [address] Tel: [phone] Fax:  [phone] Test Date:  11/16/2018  Patient: Cynthia Gordon DOB: Dec 24, 1960 Physician: Nita Sickle, DO  Sex: Female Height: 5\' 3"  Ref Phys: Evangeline Dakin  ID#: [id number] Temp: 36.4C Technician:    Patient Complaints: This is a 58 year-old female referred for evaluation of bilateral hand paresthesias, worse over the left 4th and 5th fingers.  NCV & EMG Findings: Extensive electrodiagnostic testing of the left upper extremity and additional studies of the left shows:  1. Bilateral median, ulnar, and mixed palmar sensory responses are within normal limits.  Of note, left ulnar sensory response is asymmetrically reduced as compared to the right. 2. Left ulnar motor response shows conduction block and severely slowed conduction velocity across the elbow (A Elbow-B Elbow, 27 m/s) recording at the abductor digit he minimi muscle.  Additionally, left ulnar motor response recording at the first dorsal interosseous muscle shows reduced amplitude (6.2 mV) and decreased conduction velocity (A Elbow-B Elbow, 40 m/s).  Bilateral median and right ulnar motor responses within normal limits.   3. Chronic motor axonal loss changes are seen affecting the left first dorsal interosseous, abductor digiti minimi, and flexor carpi ulnaris muscles.  There is no evidence of accompanied active denervation.   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   ___________________________ Nita Sickle, DO    Nerve Conduction Studies Anti Sensory Summary Table   Stim Site NR Peak (ms) Norm Peak (ms) P-T Amp (V) Norm P-T Amp  Left Median Anti Sensory (2nd Digit)  36.4C  Wrist    2.6 <3.6 43.9 >15  Right Median Anti Sensory (2nd Digit)  36.4C  Wrist    2.5 <3.6 48.3 >15  Left Ulnar Anti Sensory  (5th Digit)  36.4C  Wrist    2.5 <3.1 33.7 >10  Right Ulnar Anti Sensory (5th Digit)  36.4C  Wrist    2.6 <3.1 44.8 >10   Motor Summary Table   Stim Site NR Onset (ms) Norm Onset (ms) O-P Amp (mV) Norm O-P Amp Site1 Site2 Delta-0 (ms) Dist (cm) Vel (m/s) Norm Vel (m/s)  Left Median Motor (Abd Poll Brev)  36.4C  Wrist    2.3 <4.0 10.4 >6 Elbow Wrist 4.5 28.0 62 >50  Elbow    6.8  10.4         Right Median Motor (Abd Poll Brev)  36.4C  Wrist    2.2 <4.0 10.3 >6 Elbow Wrist 4.1 27.0 66 >50  Elbow    6.3  9.8         Left Ulnar Motor (Abd Dig Minimi)  36.4C  Wrist    2.1 <3.1 8.0 >7 B Elbow Wrist 3.7 22.0 59 >50  B Elbow    5.8  7.7  A Elbow B Elbow 3.7 10.0 27 >50  A Elbow    9.5  3.4         Right Ulnar Motor (Abd Dig Minimi)  36.4C  Wrist    2.0 <3.1 11.0 >7 B Elbow Wrist 3.5 20.0 57 >50  B Elbow    5.5  10.8  A Elbow B Elbow 2.0 10.0 50 >50  A Elbow    7.5  9.4         Left Ulnar (FDI) Motor (1st DI)  36.4C  Wrist    3.2 <4.5 6.2 >7 B Elbow Wrist  3.8 22.0 58 >50  B Elbow    7.0  5.9  A Elbow B Elbow 2.5 10.0 40 >50  A Elbow    9.5  1.5          Comparison Summary Table   Stim Site NR Peak (ms) Norm Peak (ms) P-T Amp (V) Site1 Site2 Delta-P (ms) Norm Delta (ms)  Left Median/Ulnar Palm Comparison (Wrist - 8cm)  36.4C  Median Palm    1.8 <2.2 39.5 Median Palm Ulnar Palm 0.2   Ulnar Palm    1.6 <2.2 18.3      Right Median/Ulnar Palm Comparison (Wrist - 8cm)  36.4C  Median Palm    1.4 <2.2 75.8 Median Palm Ulnar Palm 0.1   Ulnar Palm    1.5 <2.2 28.3       EMG   Side Muscle Ins Act Fibs Psw Fasc Number Recrt Dur Dur. Amp Amp. Poly Poly. Comment  Left 1stDorInt Nml Nml Nml Nml 1- Rapid Some 1+ Few 1+ Nml Nml N/A  Left Ext Indicis Nml Nml Nml Nml Nml Nml Nml Nml Nml Nml Nml Nml N/A  Left PronatorTeres Nml Nml Nml Nml Nml Nml Nml Nml Nml Nml Nml Nml N/A  Left Biceps Nml Nml Nml Nml Nml Nml Nml Nml Nml Nml Nml Nml N/A  Left Triceps Nml Nml Nml Nml Nml Nml Nml Nml Nml Nml  Nml Nml N/A  Left ABD Dig Min Nml Nml Nml Nml 2- Mod-R Some 1+ Some 1+ Nml Nml N/A  Left FlexCarpiUln Nml Nml Nml Nml 1- Mod-R Few 1+ Some 1+ Some 1+ N/A  Right 1stDorInt Nml Nml Nml Nml Nml Nml Nml Nml Nml Nml Nml Nml N/A  Right Triceps Nml Nml Nml Nml Nml Nml Nml Nml Nml Nml Nml Nml N/A  Right PronatorTeres Nml Nml Nml Nml Nml Nml Nml Nml Nml Nml Nml Nml N/A  Right Biceps Nml Nml Nml Nml Nml Nml Nml Nml Nml Nml Nml Nml N/A  Right Deltoid Nml Nml Nml Nml Nml Nml Nml Nml Nml Nml Nml Nml N/A      Waveforms:

## 2018-11-17 DIAGNOSIS — K5901 Slow transit constipation: Secondary | ICD-10-CM | POA: Diagnosis not present

## 2018-11-17 DIAGNOSIS — K219 Gastro-esophageal reflux disease without esophagitis: Secondary | ICD-10-CM | POA: Diagnosis not present

## 2018-11-22 DIAGNOSIS — G5622 Lesion of ulnar nerve, left upper limb: Secondary | ICD-10-CM | POA: Diagnosis not present

## 2019-01-21 DIAGNOSIS — G5622 Lesion of ulnar nerve, left upper limb: Secondary | ICD-10-CM | POA: Diagnosis not present

## 2019-03-07 DIAGNOSIS — K3189 Other diseases of stomach and duodenum: Secondary | ICD-10-CM | POA: Diagnosis not present

## 2019-03-07 DIAGNOSIS — K219 Gastro-esophageal reflux disease without esophagitis: Secondary | ICD-10-CM | POA: Diagnosis not present

## 2019-04-13 DIAGNOSIS — Z0000 Encounter for general adult medical examination without abnormal findings: Secondary | ICD-10-CM | POA: Diagnosis not present

## 2019-04-13 DIAGNOSIS — Z1159 Encounter for screening for other viral diseases: Secondary | ICD-10-CM | POA: Diagnosis not present

## 2019-04-20 DIAGNOSIS — G4730 Sleep apnea, unspecified: Secondary | ICD-10-CM | POA: Diagnosis not present

## 2019-04-20 DIAGNOSIS — E7800 Pure hypercholesterolemia, unspecified: Secondary | ICD-10-CM | POA: Diagnosis not present

## 2019-04-20 DIAGNOSIS — Z0000 Encounter for general adult medical examination without abnormal findings: Secondary | ICD-10-CM | POA: Diagnosis not present

## 2019-04-20 DIAGNOSIS — F418 Other specified anxiety disorders: Secondary | ICD-10-CM | POA: Diagnosis not present

## 2019-04-20 DIAGNOSIS — R161 Splenomegaly, not elsewhere classified: Secondary | ICD-10-CM | POA: Diagnosis not present

## 2019-04-26 ENCOUNTER — Other Ambulatory Visit: Payer: Self-pay | Admitting: Internal Medicine

## 2019-04-26 DIAGNOSIS — R161 Splenomegaly, not elsewhere classified: Secondary | ICD-10-CM

## 2019-04-29 DIAGNOSIS — M810 Age-related osteoporosis without current pathological fracture: Secondary | ICD-10-CM | POA: Diagnosis not present

## 2019-04-29 DIAGNOSIS — M1990 Unspecified osteoarthritis, unspecified site: Secondary | ICD-10-CM | POA: Diagnosis not present

## 2019-04-29 DIAGNOSIS — M2550 Pain in unspecified joint: Secondary | ICD-10-CM | POA: Diagnosis not present

## 2019-04-29 DIAGNOSIS — M79643 Pain in unspecified hand: Secondary | ICD-10-CM | POA: Diagnosis not present

## 2019-05-04 DIAGNOSIS — M810 Age-related osteoporosis without current pathological fracture: Secondary | ICD-10-CM | POA: Diagnosis not present

## 2019-05-11 DIAGNOSIS — Z96642 Presence of left artificial hip joint: Secondary | ICD-10-CM | POA: Diagnosis not present

## 2019-05-11 DIAGNOSIS — M25552 Pain in left hip: Secondary | ICD-10-CM | POA: Diagnosis not present

## 2019-05-11 DIAGNOSIS — M8700 Idiopathic aseptic necrosis of unspecified bone: Secondary | ICD-10-CM | POA: Diagnosis not present

## 2019-05-19 ENCOUNTER — Ambulatory Visit
Admission: RE | Admit: 2019-05-19 | Discharge: 2019-05-19 | Disposition: A | Discharge status: Home / Self Care | Payer: Federal, State, Local not specified - PPO | Source: Ambulatory Visit | Attending: Internal Medicine | Admitting: Internal Medicine

## 2019-05-19 DIAGNOSIS — R161 Splenomegaly, not elsewhere classified: Secondary | ICD-10-CM | POA: Diagnosis not present

## 2019-06-02 ENCOUNTER — Other Ambulatory Visit: Payer: Self-pay | Admitting: Internal Medicine

## 2019-06-02 DIAGNOSIS — R935 Abnormal findings on diagnostic imaging of other abdominal regions, including retroperitoneum: Secondary | ICD-10-CM

## 2019-06-10 ENCOUNTER — Other Ambulatory Visit: Payer: Self-pay | Admitting: General Surgery

## 2019-06-13 ENCOUNTER — Institutional Professional Consult (permissible substitution): Payer: Federal, State, Local not specified - PPO | Admitting: Pulmonary Disease

## 2019-06-21 ENCOUNTER — Other Ambulatory Visit: Payer: Self-pay

## 2019-06-21 ENCOUNTER — Ambulatory Visit
Admission: RE | Admit: 2019-06-21 | Discharge: 2019-06-21 | Disposition: A | Discharge status: Home / Self Care | Payer: Federal, State, Local not specified - PPO | Source: Ambulatory Visit | Attending: Internal Medicine | Admitting: Internal Medicine

## 2019-06-21 DIAGNOSIS — R935 Abnormal findings on diagnostic imaging of other abdominal regions, including retroperitoneum: Secondary | ICD-10-CM

## 2019-06-21 DIAGNOSIS — K760 Fatty (change of) liver, not elsewhere classified: Secondary | ICD-10-CM | POA: Diagnosis not present

## 2019-06-21 MED ORDER — GADOBENATE DIMEGLUMINE 529 MG/ML IV SOLN
14.0000 mL | Freq: Once | INTRAVENOUS | Status: AC | PRN
  Administered 2019-06-21: 14 mL via INTRAVENOUS

## 2019-06-22 ENCOUNTER — Other Ambulatory Visit: Payer: Federal, State, Local not specified - PPO

## 2019-07-01 ENCOUNTER — Ambulatory Visit: Payer: Federal, State, Local not specified - PPO | Admitting: Pulmonary Disease

## 2019-07-01 ENCOUNTER — Other Ambulatory Visit: Payer: Self-pay

## 2019-07-01 ENCOUNTER — Encounter: Payer: Self-pay | Admitting: Pulmonary Disease

## 2019-07-01 DIAGNOSIS — G4710 Hypersomnia, unspecified: Secondary | ICD-10-CM | POA: Insufficient documentation

## 2019-07-01 NOTE — Progress Notes (Signed)
Subjective:    Patient ID: Cynthia Gordon, female    DOB: [birth year], 58 y.o.   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 Her husband was a dentist who passed away in 01/13/15 due to early onset Alzheimer's.  She has been working from home during the pandemic, lives with her father.  She has been maintained on Xanax for anxiety for more than 20 years.  She takes Topamax and Wellbutrin for migraines. She reports sleep onset insomnia and uses Ambien almost every night.  Loud snoring has been noted by family members.  She reports nonrefreshing sleep and excessive daytime somnolence. Epworth sleepiness score is 17 and she reports sleepiness while sitting and reading, watching TV, lying down to rest in the afternoons or sitting quietly after lunch. Bedtime is between 1030 and midnight, even after taking Ambien and Flexeril and can take it 30 to 60 minutes to fall asleep, she sleeps on her back but rolls over on her side with 2 pillows, reports 3-4 nocturnal awakenings including nocturia and is out of bed by 8 AM feeling tired with dryness of mouth but denies morning headaches.  Occasionally she will get a 3 PM headache. She has gained 10 pounds in the last year There is no history suggestive of cataplexy, sleep paralysis or parasomnias  We reviewed home sleep test from a year ago which shows very mild OSA with AHI 5/hour with 28 hypopneas and 4 apneas over an 8-hour total sleep time  Significant tests/ events reviewed  Home sleep test 04/2018 AHI 5/hour, lowest desaturation 81%, 184 minutes saturation less than 89%   Past Medical History:  Diagnosis Date  . Anxiety   . Asthma   . Bronchitis    hx of  . Depression with anxiety   . Environmental allergies   . GERD (gastroesophageal reflux disease)   . History of nuclear stress test 03/08/2013   lexiscan; normal study; EF 59%  . Hx of migraines   . Hypertension   . Osteoporosis   .  Seasonal allergies     Past Surgical History:  Procedure Laterality Date  . ABDOMINAL HYSTERECTOMY    . I&D EXTREMITY Right 04/28/2013   Procedure: IRRIGATION AND DEBRIDEMENT RIGHT WRIST/FOREARM;  Surgeon: Kathryne Hitch, MD;  Location: [facility] OR;  Service: Orthopedics;  Laterality: Right;  . INCISION AND DRAINAGE FOREARM / WRIST DEEP Right 04/28/2013   Dr Magnus Ivan  . ROTATOR CUFF REPAIR Bilateral   . TOTAL HIP ARTHROPLASTY Left   . WRIST SURGERY Right    plate and screws in wrist    Allergies  Allergen Reactions  . Amoxicillin Rash  . Codeine Itching  . Demerol [Meperidine] Itching  . Morphine And Related Itching  . Penicillin G Rash    Has patient had a PCN reaction causing immediate rash, facial/tongue/throat swelling, SOB or lightheadedness with hypotension: Yes Has patient had a PCN reaction causing severe rash involving mucus membranes or skin necrosis: Yes Has patient had a PCN reaction that required hospitalization No Has patient had a PCN reaction occurring within the last 10 years: No If all of the above answers are "NO", then may proceed with Cephalosporin use.   Doylene Bode [Sulfamethoxazole-Trimethoprim] Rash  . Tetracyclines & Related Rash    Allergies  Allergen Reactions  . Amoxicillin Rash  . Codeine Itching  . Demerol [Meperidine] Itching  . Morphine And Related Itching  . Penicillin G Rash    Has patient had  a PCN reaction causing immediate rash, facial/tongue/throat swelling, SOB or lightheadedness with hypotension: Yes Has patient had a PCN reaction causing severe rash involving mucus membranes or skin necrosis: Yes Has patient had a PCN reaction that required hospitalization No Has patient had a PCN reaction occurring within the last 10 years: No If all of the above answers are "NO", then may proceed with Cephalosporin use.   Sarina Ill [Sulfamethoxazole-Trimethoprim] Rash  . Tetracyclines & Related Rash    Social History   Socioeconomic History   . Marital status: Widowed    Spouse name: Not on file  . Number of children: Not on file  . Years of education: Not on file  . Highest education level: Not on file  Occupational History  . Not on file  Social Needs  . Financial resource strain: Not on file  . Food insecurity    Worry: Not on file    Inability: Not on file  . Transportation needs    Medical: Not on file    Non-medical: Not on file  Tobacco Use  . Smoking status: Never Smoker  . Smokeless tobacco: Never Used  Substance and Sexual Activity  . Alcohol use: Yes    Comment: social  . Drug use: No  . Sexual activity: Not on file  Lifestyle  . Physical activity    Days per week: Not on file    Minutes per session: Not on file  . Stress: Not on file  Relationships  . Social Herbalist on phone: Not on file    Gets together: Not on file    Attends religious service: Not on file    Active member of club or organization: Not on file    Attends meetings of clubs or organizations: Not on file    Relationship status: Not on file  . Intimate partner violence    Fear of current or ex partner: Not on file    Emotionally abused: Not on file    Physically abused: Not on file    Forced sexual activity: Not on file  Other Topics Concern  . Not on file  Social History Narrative  . Not on file     Family History  Problem Relation Age of Onset  . Cancer - Lung Mother   . Cancer - Colon Father   . Healthy Brother   . Cancer - Other Maternal Grandmother 59       brain cancer from melonoma  . Cancer - Other Maternal Grandfather 80       stomach cancer  . Cancer - Lung Paternal Grandfather      Review of Systems  Constitutional: Positive for unexpected weight change. Negative for fever.  HENT: Negative for congestion, dental problem, ear pain, nosebleeds, postnasal drip, rhinorrhea, sinus pressure, sneezing, sore throat and trouble swallowing.   Eyes: Negative for redness and itching.  Respiratory:  Negative for cough, chest tightness, shortness of breath and wheezing.   Cardiovascular: Negative for palpitations and leg swelling.  Gastrointestinal: Negative for nausea and vomiting.  Genitourinary: Negative for dysuria.  Musculoskeletal: Positive for joint swelling.  Skin: Negative for rash.  Neurological: Positive for headaches.  Hematological: Does not bruise/bleed easily.  Psychiatric/Behavioral: Negative for dysphoric mood. The patient is nervous/anxious.        Objective:   Physical Exam  Gen. Pleasant,  in no distress, normal affect ENT - no pallor,icterus, no post nasal drip, class 2-3 airway Neck: No JVD, no thyromegaly, no  carotid bruits Lungs: no use of accessory muscles, no dullness to percussion, decreased without rales or rhonchi  Cardiovascular: Rhythm regular, heart sounds  normal, no murmurs or gallops, no peripheral edema Abdomen: soft and non-tender, no hepatosplenomegaly, BS normal. Musculoskeletal: No deformities, no cyanosis or clubbing Neuro:  alert, non focal, no tremors       Assessment & Plan:

## 2019-07-01 NOTE — Assessment & Plan Note (Signed)
Given excessive daytime somnolence, narrow pharyngeal exam, witnessed apneas & loud snoring, obstructive sleep apnea is very likely & an overnight polysomnogram will be scheduled as a home study. The pathophysiology of obstructive sleep apnea , it's cardiovascular consequences & modes of treatment including CPAP were discused with the patient in detail & they evidenced understanding.  If she does have significant OSA then my bias would be to use CPAP machine with nasal pillows.  She is willing to trial this. If home sleep test does not show significant sleep disordered breathing, then consider MSLT for narcolepsy

## 2019-07-01 NOTE — Patient Instructions (Signed)
Schedule home sleep test  We discussed treatment options including CPAP and dental device

## 2019-07-18 ENCOUNTER — Other Ambulatory Visit: Payer: Self-pay | Admitting: Obstetrics and Gynecology

## 2019-07-18 DIAGNOSIS — Z1231 Encounter for screening mammogram for malignant neoplasm of breast: Secondary | ICD-10-CM

## 2019-08-01 DIAGNOSIS — L738 Other specified follicular disorders: Secondary | ICD-10-CM | POA: Diagnosis not present

## 2019-08-01 DIAGNOSIS — L700 Acne vulgaris: Secondary | ICD-10-CM | POA: Diagnosis not present

## 2019-08-01 DIAGNOSIS — D225 Melanocytic nevi of trunk: Secondary | ICD-10-CM | POA: Diagnosis not present

## 2019-08-01 DIAGNOSIS — L853 Xerosis cutis: Secondary | ICD-10-CM | POA: Diagnosis not present

## 2019-08-23 ENCOUNTER — Telehealth: Payer: Self-pay | Admitting: Pulmonary Disease

## 2019-08-23 NOTE — Telephone Encounter (Signed)
LM on pt's VM to return my call to r/s HST.

## 2019-08-24 NOTE — Telephone Encounter (Signed)
LM on pt's VM to return my call to R/S HST.

## 2019-08-25 NOTE — Telephone Encounter (Signed)
LM on pt's VM to r/s.  Closing message & placing order back in Libby's office until pt calls back to r/s this appt.

## 2019-08-30 DIAGNOSIS — G4733 Obstructive sleep apnea (adult) (pediatric): Secondary | ICD-10-CM

## 2019-09-13 ENCOUNTER — Ambulatory Visit: Payer: Federal, State, Local not specified - PPO

## 2019-09-13 ENCOUNTER — Ambulatory Visit
Admission: RE | Admit: 2019-09-13 | Discharge: 2019-09-13 | Disposition: A | Discharge status: Home / Self Care | Payer: Federal, State, Local not specified - PPO | Source: Ambulatory Visit | Attending: Obstetrics and Gynecology | Admitting: Obstetrics and Gynecology

## 2019-09-13 ENCOUNTER — Other Ambulatory Visit: Payer: Self-pay

## 2019-09-13 DIAGNOSIS — Z1231 Encounter for screening mammogram for malignant neoplasm of breast: Secondary | ICD-10-CM | POA: Diagnosis not present

## 2019-09-13 DIAGNOSIS — G4710 Hypersomnia, unspecified: Secondary | ICD-10-CM

## 2019-09-15 ENCOUNTER — Telehealth: Payer: Self-pay | Admitting: Pulmonary Disease

## 2019-09-15 DIAGNOSIS — G4733 Obstructive sleep apnea (adult) (pediatric): Secondary | ICD-10-CM

## 2019-09-15 DIAGNOSIS — G4710 Hypersomnia, unspecified: Secondary | ICD-10-CM

## 2019-09-15 NOTE — Telephone Encounter (Signed)
Very mild OSA 6/hour especially when she lies on her back Treatment may simply be to avoid sleeping on her back.  But since she is symptomatic and this was her second test, would suggest auto CPAP 5 to 15 cm with nasal pillows, office visit in 6 weeks

## 2019-09-19 NOTE — Telephone Encounter (Signed)
Called the patient to make her aware of the results. Patient voiced understanding. Was set up for video visit on 10/26/19 with NP. Order placed. Nothing further needed at this time.

## 2019-09-27 DIAGNOSIS — G4733 Obstructive sleep apnea (adult) (pediatric): Secondary | ICD-10-CM | POA: Diagnosis not present

## 2019-10-25 ENCOUNTER — Telehealth: Payer: Self-pay | Admitting: Adult Health

## 2019-10-25 NOTE — Telephone Encounter (Signed)
Patient scheduled for new CPAP follow up tomorrow @ 1100 with TP However, per [facility] patient has only been on CPAP therapy for 28 days Insurance requires 1st visit be 31-90 days after CPAP setup  Left detailed message on named VM (dpr on file) explaining the above Per my calculations Friday should be 31 days, but would suggest she [facility] for next week to be on the safe side Advised pt she may simply [facility] with one of the ladies that answers the phone or if she like to speak with me, she may ask for me.   Will leave message open to ensure this gets taken care of.

## 2019-10-26 ENCOUNTER — Telehealth: Payer: Federal, State, Local not specified - PPO | Admitting: Adult Health

## 2019-10-28 DIAGNOSIS — G4733 Obstructive sleep apnea (adult) (pediatric): Secondary | ICD-10-CM | POA: Diagnosis not present

## 2019-11-01 NOTE — Telephone Encounter (Signed)
Patient is now scheduled to see TP on 3.24.21 Will sign off

## 2019-11-09 DIAGNOSIS — F419 Anxiety disorder, unspecified: Secondary | ICD-10-CM | POA: Diagnosis not present

## 2019-11-09 DIAGNOSIS — N952 Postmenopausal atrophic vaginitis: Secondary | ICD-10-CM | POA: Diagnosis not present

## 2019-11-09 DIAGNOSIS — Z01419 Encounter for gynecological examination (general) (routine) without abnormal findings: Secondary | ICD-10-CM | POA: Diagnosis not present

## 2019-11-11 DIAGNOSIS — F418 Other specified anxiety disorders: Secondary | ICD-10-CM | POA: Diagnosis not present

## 2019-11-11 DIAGNOSIS — G4730 Sleep apnea, unspecified: Secondary | ICD-10-CM | POA: Diagnosis not present

## 2019-11-11 DIAGNOSIS — F419 Anxiety disorder, unspecified: Secondary | ICD-10-CM | POA: Diagnosis not present

## 2019-11-11 DIAGNOSIS — F439 Reaction to severe stress, unspecified: Secondary | ICD-10-CM | POA: Diagnosis not present

## 2019-11-25 DIAGNOSIS — G4733 Obstructive sleep apnea (adult) (pediatric): Secondary | ICD-10-CM | POA: Diagnosis not present

## 2019-11-28 ENCOUNTER — Telehealth: Payer: Self-pay | Admitting: Pulmonary Disease

## 2019-11-28 DIAGNOSIS — G4733 Obstructive sleep apnea (adult) (pediatric): Secondary | ICD-10-CM

## 2019-11-28 NOTE — Telephone Encounter (Signed)
lmtcb for pt. Pt has not been seen by our office since starting cpap on 09/24/2019.  Will need office visit for insurance to continue paying for cpap.

## 2019-11-29 NOTE — Telephone Encounter (Signed)
Called and spoke to pt. Pt states she has not been on her CPAP for about a month because the mask is uncomfortable and causes skin irritation. Pt states she needs a new mask before she is able to wear her CPAP again. Pt needs to have at least 31 days of data before an appt can be made for her to have her CPAP follow up appt. Called Melissa with [employer] and she is confident that insurance will pay for a new mask. New order placed. Called and spoke to pt. Pt aware she will be contacted to get a new mask. Pt aware to contact our office once she gets the new mask and begins wearing her CPAP so we can schedule her first CPAP follow up appt. Nothing further needed at this time.   Will forward to Dr. Vassie Loll as Lorain Childes.

## 2019-11-30 ENCOUNTER — Telehealth: Payer: Federal, State, Local not specified - PPO | Admitting: Acute Care

## 2019-12-01 ENCOUNTER — Telehealth: Payer: Self-pay | Admitting: Pulmonary Disease

## 2019-12-01 NOTE — Telephone Encounter (Signed)
Spoke with patient. She was calling back in regards to her cpap mask. After several calls to [employer] she was able to reach someone. She went to the satellite office in [place] for the mask and was advised that she will have to pay a copay for the mask. She was under the impression that insurance will pay for the mask completely. I advised her that I would call Melissa to see if this was the case. She has an appt with [employer] on Monday 3/29 to pick out the mask and wants to have this taken care of before then.   Called Melissa and left a detailed message for her.   Called patient back to let her know that I left a message for Melissa, she verbalized understanding. Will leave encounter open for follow up.

## 2019-12-02 NOTE — Telephone Encounter (Signed)
Spoke with pt, relayed below info to her.  Pt expressed understanding.  Nothing further needed at this time- will close encounter.

## 2019-12-02 NOTE — Telephone Encounter (Signed)
Patient is returning phone call. Patient phone number is [phone].

## 2019-12-02 NOTE — Telephone Encounter (Signed)
I called and spoke with Cynthia Gordon  She states that it may be that the pt still owes some deductible  They will discuss everything in detail with her when she comes in on 3/29  As long as she has a credit card on file should not have to pay anything that day  If any further questions can call [phone] ext [phone]   I ATC the pt and there was no answer LMTCB

## 2019-12-26 DIAGNOSIS — G4733 Obstructive sleep apnea (adult) (pediatric): Secondary | ICD-10-CM | POA: Diagnosis not present

## 2020-01-25 DIAGNOSIS — G4733 Obstructive sleep apnea (adult) (pediatric): Secondary | ICD-10-CM | POA: Diagnosis not present

## 2020-02-11 IMAGING — US US ABDOMEN COMPLETE
1 series · 13 of 25 positions shown · non-contrast
Comparison: CTA chest and abdomen and pelvis CT with contrast dated
01/12/2016. Complete abdomen ultrasound dated 07/20/2013.

CLINICAL DATA: Splenomegaly.

EXAM:
ABDOMEN ULTRASOUND COMPLETE

[Series 1: us abdomen complete · 0.20mm/px · 13 of 92 slices shown]
[im 1/92]
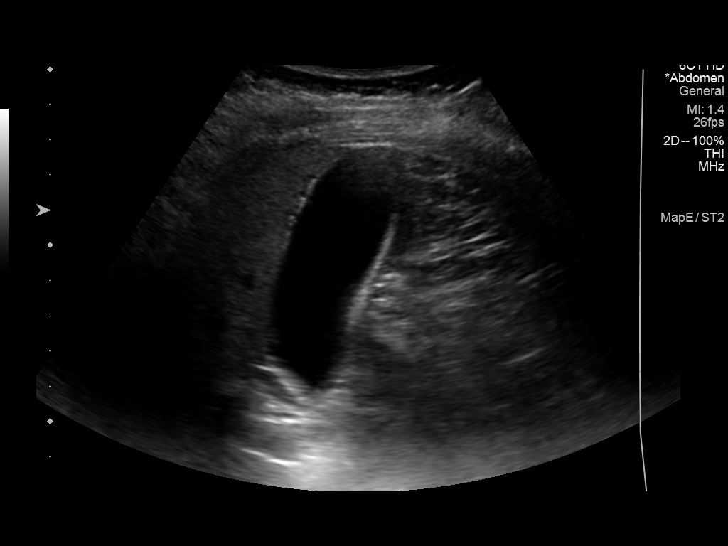
[im 8/92]
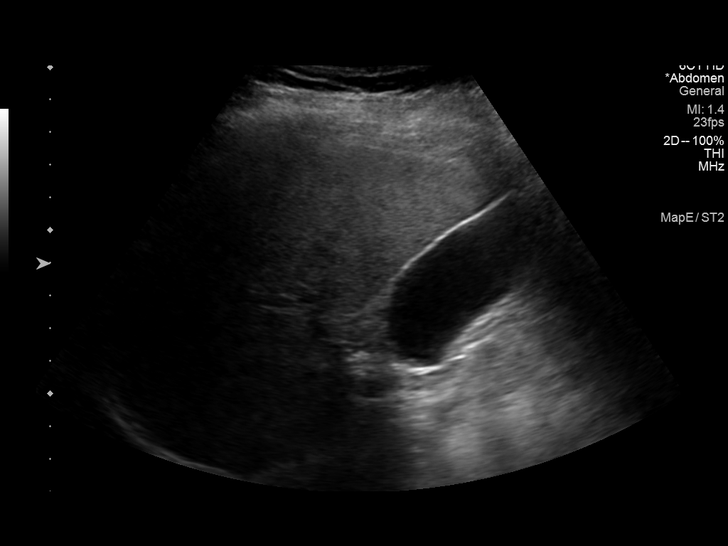
[im 16/92]
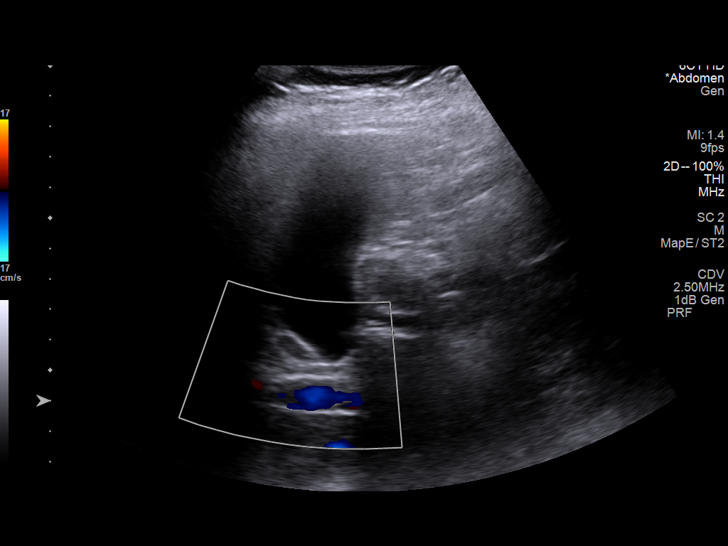
[im 23/92]
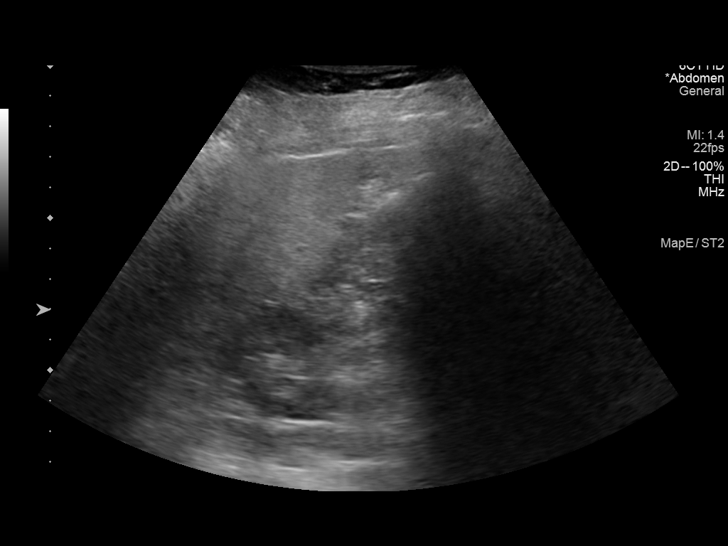
[im 31/92]
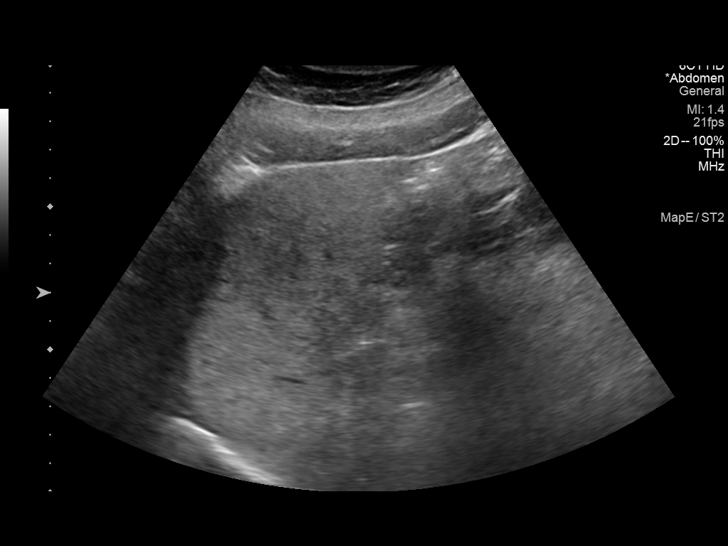
[im 38/92]
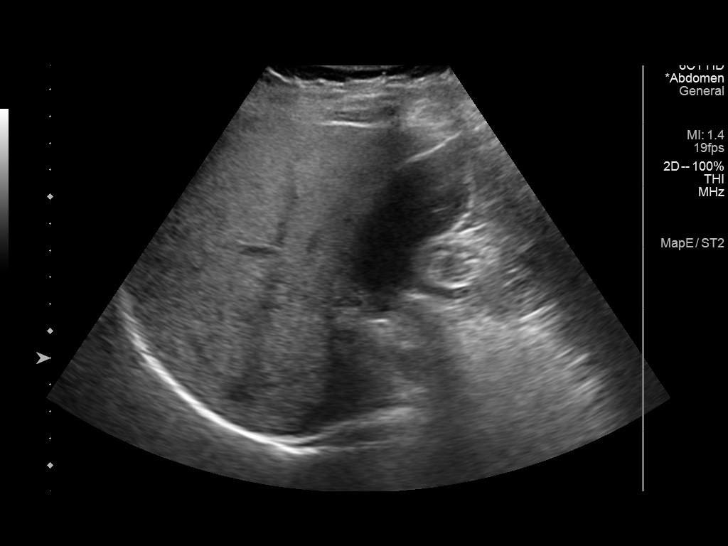
[im 46/92]
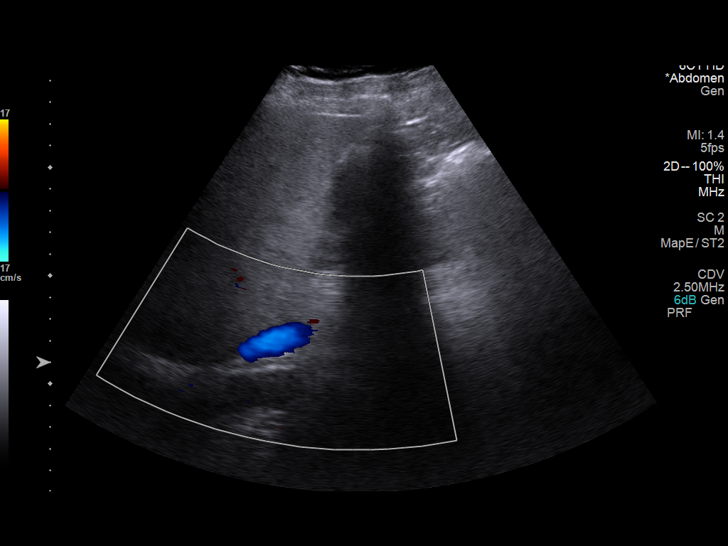
[im 54/92]
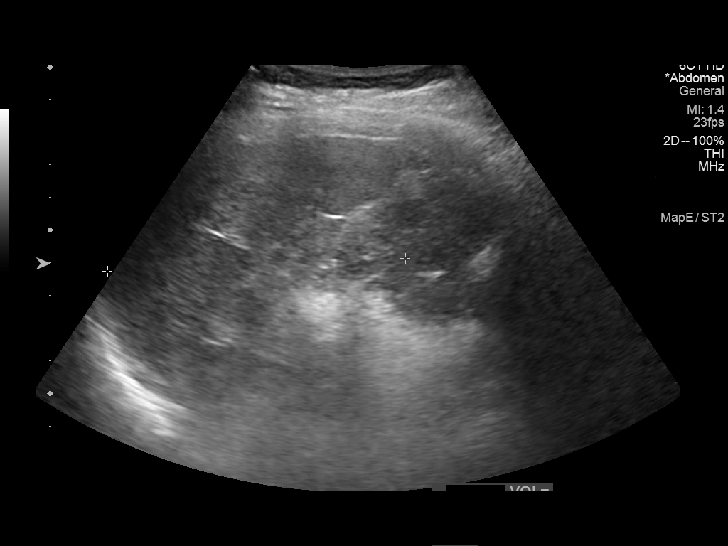
[im 61/92]
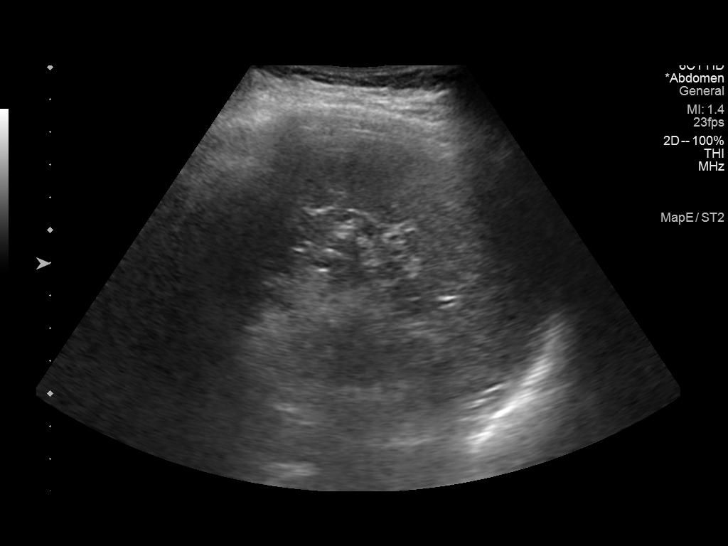
[im 69/92]
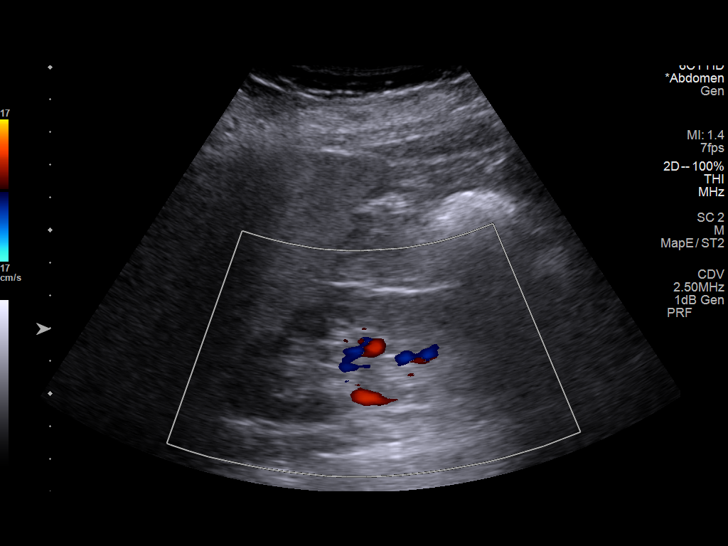
[im 76/92]
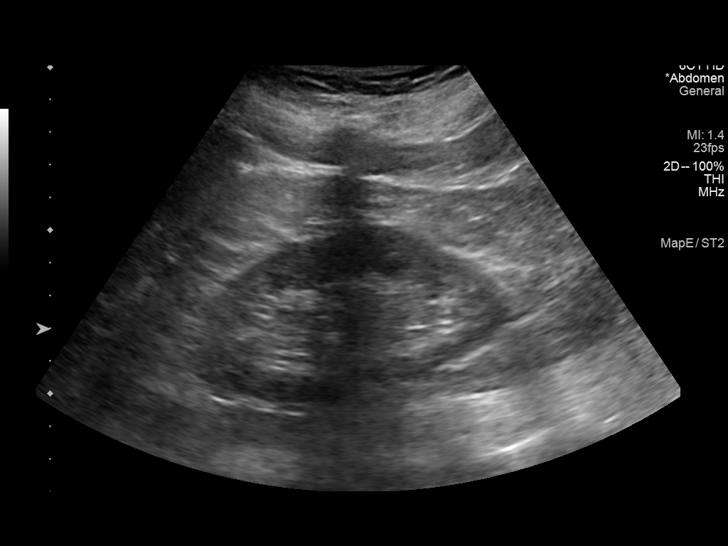
[im 84/92]
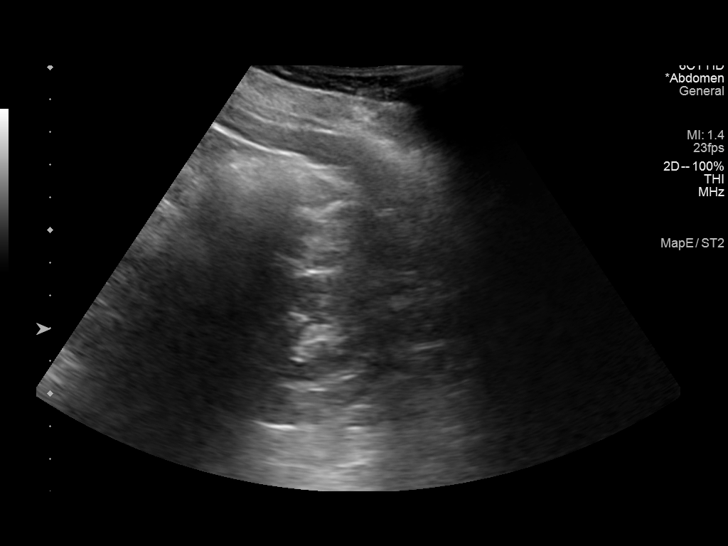
[im 92/92]
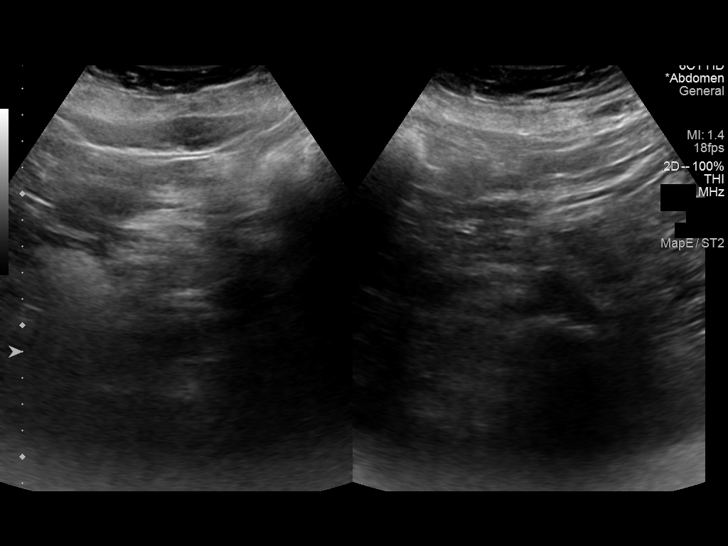

[13 of 25 positions shown; findings below may reference images not displayed]

FINDINGS: Gallbladder: No gallstones or wall thickening visualized. No
sonographic Murphy sign noted by sonographer.

Common bile duct: Diameter: 3.0 mm

Liver: Diffusely echogenic with the exception of a 1.2 x 1.0 x
cm irregular hypoechoic area in the lateral segment of the left
lobe, posteriorly. This was not seen on 07/20/2013 or on the CT
dated 01/11/2016. Portal vein is patent on color Doppler imaging
with normal direction of blood flow towards the liver.

IVC: No abnormality visualized.

Pancreas: Visualized portion unremarkable.

Spleen: Size and appearance within normal limits.

Right Kidney: Length: 10.6 cm. Echogenicity within normal limits. No
mass or hydronephrosis visualized.

Left Kidney: Length: 10.0 cm. Echogenicity within normal limits. No
mass or hydronephrosis visualized.

Abdominal aorta: No aneurysm visualized.

Other findings: No free peritoneal fluid.
IMPRESSION: 1. Interval 1.2 cm mass in the lateral segment of the left lobe of
the liver. Further evaluation with pre and postcontrast magnetic
resonance imaging of the liver is recommended to better characterize
this mass.
2. Interval diffuse increased echogenicity of the remainder of the
liver. This is most likely due to steatosis. However, chronic
hepatitis and cirrhosis can also produce this appearance.
3. No evidence of splenomegaly by ultrasound.

## 2020-02-25 DIAGNOSIS — G4733 Obstructive sleep apnea (adult) (pediatric): Secondary | ICD-10-CM | POA: Diagnosis not present

## 2020-03-15 DIAGNOSIS — M25572 Pain in left ankle and joints of left foot: Secondary | ICD-10-CM | POA: Diagnosis not present

## 2020-03-26 DIAGNOSIS — G4733 Obstructive sleep apnea (adult) (pediatric): Secondary | ICD-10-CM | POA: Diagnosis not present

## 2020-04-02 ENCOUNTER — Ambulatory Visit: Payer: Federal, State, Local not specified - PPO | Admitting: Orthopedic Surgery

## 2020-04-02 ENCOUNTER — Ambulatory Visit: Payer: Self-pay

## 2020-04-02 DIAGNOSIS — M25572 Pain in left ankle and joints of left foot: Secondary | ICD-10-CM | POA: Diagnosis not present

## 2020-04-08 ENCOUNTER — Encounter: Payer: Self-pay | Admitting: Orthopedic Surgery

## 2020-04-08 NOTE — Progress Notes (Signed)
Office Visit Note   Patient: Cynthia Gordon           Date of Birth: 16-Jan-1961           MRN: [id number] Visit Date: 04/02/2020 Requested by: Merri Brunette, MD [address] [place],  [place] [address] PCP: Merri Brunette, MD  Subjective: Chief Complaint  Patient presents with  . Left Foot - Pain  . Left Ankle - Pain    HPI: Cynthia Gordon is a 59 y.o. female who presents to the office complaining of left ankle/foot pain.  Patient notes that she fell down steps 2 weeks ago resulting in lateral foot/ankle pain.  She states that since the initial injury, her pain has improved by about 60%..  She went to [employer] where she was instructed to wear the boot.  She wore a boot for about 1 week before discontinuing the boot due to its clumsiness.  She is able to weight-bear on the left foot.  She localizes pain to the dorsal lateral midfoot.              ROS: All systems reviewed are negative as they relate to the chief complaint within the history of present illness.  Patient denies fevers or chills.  Assessment & Plan: Visit Diagnoses:  1. Pain in left ankle and joints of left foot     Plan: Patient is a 59 year old female who presents with 2 weeks of left ankle pain after falling down 2 stairs.  Radiographs of the left foot and left ankle are unremarkable today.  She states that she is had 60% improvement in her pain over the last 2 weeks since her injury.  She is walking with a significant antalgic gait but she does not have any specific area of maximal tenderness on exam today.  Plan to continue with ASO brace over the next 3 weeks.  Follow-up in 3 weeks for clinical recheck.  Recommended that patient return to the office sooner if her pain worsens in the meantime.  She understands and agrees with plan.  Follow-up in 3 weeks.  Follow-Up Instructions: No follow-ups on file.   Orders:  Orders Placed This Encounter  Procedures  . XR Foot Complete Left  . XR  Ankle Complete Left   No orders of the defined types were placed in this encounter.     Procedures: No procedures performed   Clinical Data: No additional findings.  Objective: Vital Signs: There were no vitals taken for this visit.  Physical Exam:  Constitutional: Patient appears well-developed HEENT:  Head: Normocephalic Eyes:EOM are normal Neck: Normal range of motion Cardiovascular: Normal rate Pulmonary/chest: Effort normal Neurologic: Patient is alert Skin: Skin is warm Psychiatric: Patient has normal mood and affect  Ortho Exam: Orthopedic exam demonstrates diffuse tenderness to palpation throughout the left lateral ankle.  She has swelling throughout the lateral foot and ankle.  She has a significant antalgic gait.  No area of maximal tenderness.  Dorsiflexion/plantarflexion/eversion/inversion are all intact actively.  No significant tenderness palpation over the Lisfranc complex.  No significant tenderness over the medial malleolus.    Specialty Comments:  No specialty comments available.  Imaging: No results found.   PMFS History: Patient Active Problem List   Diagnosis Date Noted  . Hypersomnolence 07/01/2019  . Bilateral hearing loss 05/04/2017  . Strain of muscle of right hip 09/14/2015  . Rectocele 08/01/2015  . 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 Avascular necrosis (HCC)  07/11/2015  . Cellulitis and abscess of upper arm and forearm, right 04/28/2013  . Chest pain at rest 03/03/2013  . HTN (hypertension) 03/03/2013   Past Medical History:  Diagnosis Date  . Anxiety   . Asthma   . Bronchitis    hx of  . Depression with anxiety   . Environmental allergies   . GERD (gastroesophageal reflux disease)   . History of nuclear stress test 03/08/2013   lexiscan; normal study; EF 59%  . Hx of migraines   . Hypertension   . Osteoporosis   . Seasonal allergies     Family History  Problem Relation Age of Onset  . Cancer - Lung  Mother   . Cancer - Colon Father   . Healthy Brother   . Cancer - Other Maternal Grandmother 80       brain cancer from melonoma  . Cancer - Other Maternal Grandfather 80       stomach cancer  . Cancer - Lung Paternal Grandfather     Past Surgical History:  Procedure Laterality Date  . ABDOMINAL HYSTERECTOMY    . I & D EXTREMITY Right 04/28/2013   Procedure: IRRIGATION AND DEBRIDEMENT RIGHT WRIST/FOREARM;  Surgeon: Kathryne Hitch, MD;  Location: [facility] OR;  Service: Orthopedics;  Laterality: Right;  . INCISION AND DRAINAGE FOREARM / WRIST DEEP Right 04/28/2013   Dr Magnus Ivan  . ROTATOR CUFF REPAIR Bilateral   . TOTAL HIP ARTHROPLASTY Left   . WRIST SURGERY Right    plate and screws in wrist   Social History   Occupational History  . Not on file  Tobacco Use  . Smoking status: Never Smoker  . Smokeless tobacco: Never Used  Substance and Sexual Activity  . Alcohol use: Yes    Comment: social  . Drug use: No  . Sexual activity: Not on file

## 2020-04-23 ENCOUNTER — Ambulatory Visit: Payer: Federal, State, Local not specified - PPO | Admitting: Orthopedic Surgery

## 2020-04-23 DIAGNOSIS — M25572 Pain in left ankle and joints of left foot: Secondary | ICD-10-CM | POA: Diagnosis not present

## 2020-04-24 DIAGNOSIS — Z0000 Encounter for general adult medical examination without abnormal findings: Secondary | ICD-10-CM | POA: Diagnosis not present

## 2020-04-24 DIAGNOSIS — E7800 Pure hypercholesterolemia, unspecified: Secondary | ICD-10-CM | POA: Diagnosis not present

## 2020-04-25 DIAGNOSIS — J309 Allergic rhinitis, unspecified: Secondary | ICD-10-CM | POA: Diagnosis not present

## 2020-04-25 DIAGNOSIS — F418 Other specified anxiety disorders: Secondary | ICD-10-CM | POA: Diagnosis not present

## 2020-04-25 DIAGNOSIS — N1831 Chronic kidney disease, stage 3a: Secondary | ICD-10-CM | POA: Diagnosis not present

## 2020-04-25 DIAGNOSIS — I10 Essential (primary) hypertension: Secondary | ICD-10-CM | POA: Diagnosis not present

## 2020-04-25 DIAGNOSIS — R062 Wheezing: Secondary | ICD-10-CM | POA: Diagnosis not present

## 2020-04-25 DIAGNOSIS — Z0000 Encounter for general adult medical examination without abnormal findings: Secondary | ICD-10-CM | POA: Diagnosis not present

## 2020-04-25 DIAGNOSIS — R05 Cough: Secondary | ICD-10-CM | POA: Diagnosis not present

## 2020-04-26 DIAGNOSIS — G4733 Obstructive sleep apnea (adult) (pediatric): Secondary | ICD-10-CM | POA: Diagnosis not present

## 2020-04-29 ENCOUNTER — Encounter: Payer: Self-pay | Admitting: Orthopedic Surgery

## 2020-04-29 NOTE — Progress Notes (Signed)
Office Visit Note   Patient: Cynthia Gordon           Date of Birth: 21-May-1961           MRN: [id number] Visit Date: 04/23/2020 Requested by: Merri Brunette, MD [address] [place],  [place] [address] PCP: Merri Brunette, MD  Subjective: Chief Complaint  Patient presents with  . Left Ankle - Follow-up  . Left Foot - Follow-up    HPI: Cynthia Gordon is a 59 y.o. female who presents to the office complaining of left foot/ankle pain.  Patient presents for 3-week follow-up.  She notes her pain is improved since last office visit but she still notes lateral soreness and pain across the top of her ankle.  She has been using an ASO brace with good relief of symptoms but when she goes without the ASO brace she notes that her ankle pain is significantly increased.  She is unable to walk to her mailbox without significant increase in pain.  She is not taking any medications for pain.  Pain is not waking her up at night..                ROS: All systems reviewed are negative as they relate to the chief complaint within the history of present illness.  Patient denies fevers or chills.  Assessment & Plan: Visit Diagnoses:  1. Pain in left ankle and joints of left foot     Plan: Patient is a 59 year old female presents complaining of lateral left ankle pain primarily.  She has had some relief of pain since last office visit but overall she has had continued pain that is causing her to alter the way that she is living.  She has not been walking as much as she would like as she cannot even walk to her mailbox without significant increase in pain that causes her to take a break.  ASO brace has been helping but has not resolved her pain.  With no significant findings on radiographs from her last office visit as well as continued pain is altering her lifestyle, ordered MRI of the left ankle for further evaluation.  Follow-up after MRI to review results.  Patient agrees  plan.  Follow-Up Instructions: No follow-ups on file.   Orders:  Orders Placed This Encounter  Procedures  . MR Ankle Left w/o contrast   No orders of the defined types were placed in this encounter.     Procedures: No procedures performed   Clinical Data: No additional findings.  Objective: Vital Signs: There were no vitals taken for this visit.  Physical Exam:  Constitutional: Patient appears well-developed HEENT:  Head: Normocephalic Eyes:EOM are normal Neck: Normal range of motion Cardiovascular: Normal rate Pulmonary/chest: Effort normal Neurologic: Patient is alert Skin: Skin is warm Psychiatric: Patient has normal mood and affect  Ortho Exam: Ortho exam demonstrates left foot/ankle with moderate tenderness to palpation over the fifth metatarsal base as well as the ATFL and lateral malleolus.  She also has tenderness over the anterior ankle joint.  Pain is worse with passive inversion of the left foot.  Moderate antalgic gait noted.  Guarding with anterior drawer test but laxity noted compared with the contralateral ankle.  Swelling is present diffusely throughout the lateral left ankle.  Syndesmosis intact.  No tenderness over the medial malleolus.  No tenderness over the plantar fascia, Lisfranc complex, metatarsals aside from the fifth metatarsal base, Achilles tendon, retrocalcaneal space.  Active dorsiflexion, plantarflexion, inversion,  eversion intact.  Specialty Comments:  No specialty comments available.  Imaging: No results found.   PMFS History: Patient Active Problem List   Diagnosis Date Noted  . Hypersomnolence 07/01/2019  . Bilateral hearing loss 05/04/2017  . Strain of muscle of right hip 09/14/2015  . Rectocele 08/01/2015  . 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 Avascular necrosis (HCC) 07/11/2015  . Cellulitis and abscess of upper arm and forearm, right 04/28/2013  . Chest pain at rest 03/03/2013  . HTN (hypertension)  03/03/2013   Past Medical History:  Diagnosis Date  . Anxiety   . Asthma   . Bronchitis    hx of  . Depression with anxiety   . Environmental allergies   . GERD (gastroesophageal reflux disease)   . History of nuclear stress test 03/08/2013   lexiscan; normal study; EF 59%  . Hx of migraines   . Hypertension   . Osteoporosis   . Seasonal allergies     Family History  Problem Relation Age of Onset  . Cancer - Lung Mother   . Cancer - Colon Father   . Healthy Brother   . Cancer - Other Maternal Grandmother 80       brain cancer from melonoma  . Cancer - Other Maternal Grandfather 80       stomach cancer  . Cancer - Lung Paternal Grandfather     Past Surgical History:  Procedure Laterality Date  . ABDOMINAL HYSTERECTOMY    . I & D EXTREMITY Right 04/28/2013   Procedure: IRRIGATION AND DEBRIDEMENT RIGHT WRIST/FOREARM;  Surgeon: Kathryne Hitch, MD;  Location: [facility] OR;  Service: Orthopedics;  Laterality: Right;  . INCISION AND DRAINAGE FOREARM / WRIST DEEP Right 04/28/2013   Dr Magnus Ivan  . ROTATOR CUFF REPAIR Bilateral   . TOTAL HIP ARTHROPLASTY Left   . WRIST SURGERY Right    plate and screws in wrist   Social History   Occupational History  . Not on file  Tobacco Use  . Smoking status: Never Smoker  . Smokeless tobacco: Never Used  Substance and Sexual Activity  . Alcohol use: Yes    Comment: social  . Drug use: No  . Sexual activity: Not on file

## 2020-04-30 DIAGNOSIS — M810 Age-related osteoporosis without current pathological fracture: Secondary | ICD-10-CM | POA: Diagnosis not present

## 2020-04-30 DIAGNOSIS — M1990 Unspecified osteoarthritis, unspecified site: Secondary | ICD-10-CM | POA: Diagnosis not present

## 2020-04-30 DIAGNOSIS — M4696 Unspecified inflammatory spondylopathy, lumbar region: Secondary | ICD-10-CM | POA: Diagnosis not present

## 2020-04-30 DIAGNOSIS — M542 Cervicalgia: Secondary | ICD-10-CM | POA: Diagnosis not present

## 2020-04-30 DIAGNOSIS — M79643 Pain in unspecified hand: Secondary | ICD-10-CM | POA: Diagnosis not present

## 2020-04-30 DIAGNOSIS — M47816 Spondylosis without myelopathy or radiculopathy, lumbar region: Secondary | ICD-10-CM | POA: Diagnosis not present

## 2020-04-30 DIAGNOSIS — M533 Sacrococcygeal disorders, not elsewhere classified: Secondary | ICD-10-CM | POA: Diagnosis not present

## 2020-04-30 DIAGNOSIS — M2550 Pain in unspecified joint: Secondary | ICD-10-CM | POA: Diagnosis not present

## 2020-04-30 DIAGNOSIS — M549 Dorsalgia, unspecified: Secondary | ICD-10-CM | POA: Diagnosis not present

## 2020-04-30 DIAGNOSIS — M47818 Spondylosis without myelopathy or radiculopathy, sacral and sacrococcygeal region: Secondary | ICD-10-CM | POA: Diagnosis not present

## 2020-04-30 DIAGNOSIS — M546 Pain in thoracic spine: Secondary | ICD-10-CM | POA: Diagnosis not present

## 2020-04-30 DIAGNOSIS — M1288 Other specific arthropathies, not elsewhere classified, other specified site: Secondary | ICD-10-CM | POA: Diagnosis not present

## 2020-05-07 DIAGNOSIS — M810 Age-related osteoporosis without current pathological fracture: Secondary | ICD-10-CM | POA: Diagnosis not present

## 2020-05-13 ENCOUNTER — Other Ambulatory Visit: Payer: Federal, State, Local not specified - PPO

## 2020-05-16 ENCOUNTER — Ambulatory Visit: Payer: Federal, State, Local not specified - PPO | Admitting: Orthopedic Surgery

## 2020-05-27 DIAGNOSIS — G4733 Obstructive sleep apnea (adult) (pediatric): Secondary | ICD-10-CM | POA: Diagnosis not present

## 2020-06-21 ENCOUNTER — Ambulatory Visit
Admission: RE | Admit: 2020-06-21 | Discharge: 2020-06-21 | Disposition: A | Discharge status: Home / Self Care | Payer: Federal, State, Local not specified - PPO | Source: Ambulatory Visit | Attending: Orthopedic Surgery | Admitting: Orthopedic Surgery

## 2020-06-21 ENCOUNTER — Other Ambulatory Visit: Payer: Self-pay

## 2020-06-21 DIAGNOSIS — M25572 Pain in left ankle and joints of left foot: Secondary | ICD-10-CM

## 2020-06-21 DIAGNOSIS — R600 Localized edema: Secondary | ICD-10-CM | POA: Diagnosis not present

## 2020-06-21 DIAGNOSIS — M19072 Primary osteoarthritis, left ankle and foot: Secondary | ICD-10-CM | POA: Diagnosis not present

## 2020-06-21 DIAGNOSIS — M7989 Other specified soft tissue disorders: Secondary | ICD-10-CM | POA: Diagnosis not present

## 2020-07-04 ENCOUNTER — Telehealth: Payer: Self-pay

## 2020-07-04 ENCOUNTER — Ambulatory Visit: Payer: Federal, State, Local not specified - PPO | Admitting: Orthopedic Surgery

## 2020-07-04 ENCOUNTER — Other Ambulatory Visit: Payer: Self-pay

## 2020-07-04 DIAGNOSIS — M545 Low back pain, unspecified: Secondary | ICD-10-CM

## 2020-07-04 NOTE — Telephone Encounter (Signed)
Can you please schedule patient an appt with Dr Altamese Cabal by Dr August Saucer for eval of anterior process calcaneus excision

## 2020-07-05 ENCOUNTER — Encounter: Payer: Self-pay | Admitting: Orthopedic Surgery

## 2020-07-05 NOTE — Progress Notes (Signed)
Office Visit Note   Patient: Cynthia Gordon           Date of Birth: 1961-07-11           MRN: [id number] Visit Date: 07/04/2020 Requested by: Merri Brunette, MD [address] [place],  [place] [address] PCP: Merri Brunette, MD  Subjective: Chief Complaint  Patient presents with  . scan review    HPI: Cynthia Gordon is a 59 year old patient with multiple orthopedic complaints today.  She reports left foot pain.  Since I have seen her she has had an MRI scan which does show a stress reaction/fracture of the anterior process of the calcaneus.  That is been going on since a fall in July.  She plans to retire November 8.  Patient also reports low back pain.  Takes about a half Norco every now and then to help with that pain.  Occasional radiation in the left and right leg but nothing that gives her any weakness.  Patient also reports right knee pain with injury to the medial aspect of the right knee at the same time as her foot was injured in July.  Denies any symptomatic instability mechanical symptoms or locking in the right knee.  Patient also reports ongoing neck pain which is a chronic problem.  Denies much in the way of radicular symptoms.              ROS: All systems reviewed are negative as they relate to the chief complaint within the history of present illness.  Patient denies  fevers or chills.   Assessment & Plan: Visit Diagnoses:  1. Low back pain, unspecified back pain laterality, unspecified chronicity, unspecified whether sciatica present     Plan: Impression is anterior process calcaneus fracture symptomatic now for 5 months.  I think that it is possible she could require surgical intervention for this problem based on the amount of disability is causing her.  Her vitamin D level is okay by history.  I would like to send her to Dr. Lajoyce Corners for surgical evaluation for this foot problem.  Regarding her low back pain she has had an MRI scan within the past 2 to 3  years.  That is reviewed today.  Like to send her to Dr. Alvester Morin for L spine ESI to see if that can help with her low back pain.  She wants to avoid surgical intervention if possible.  Regarding the knee her knee has no effusion today and is stable.  Could consider injection in the knee in the near future if her symptoms persist but it does not really look like it is a meniscal problem at this time.  Most of her pain localizes to the medial epicondyle consistent with MCL sprain.  Regarding her neck she does have a C-spine MRI within the past 5 years which shows mild to moderate spondylosis.  That could also be treated with injections if needed but she is in a work on her foot and back first.  Follow-up with me as needed.  Follow-Up Instructions: No follow-ups on file.   Orders:  Orders Placed This Encounter  Procedures  . Ambulatory referral to Physical Medicine Rehab   No orders of the defined types were placed in this encounter.     Procedures: No procedures performed   Clinical Data: No additional findings.  Objective: Vital Signs: There were no vitals taken for this visit.  Physical Exam:   Constitutional: Patient appears well-developed HEENT:  Head: Normocephalic  Eyes:EOM are normal Neck: Normal range of motion Cardiovascular: Normal rate Pulmonary/chest: Effort normal Neurologic: Patient is alert Skin: Skin is warm Psychiatric: Patient has normal mood and affect    Ortho Exam: Ortho exam demonstrates slightly antalgic gait to the left.  No real swelling in the left or right ankle.  Palpable intact nontender anterior to posterior to peroneal Achilles tendons on the left-hand side with good stability to varus stress and anterior drawer testing.  Pedal pulses intact.  Does have tenderness around the that sinus Tarsi region on the left.  No pain with pronation supination of the forefoot.  No nerve root tension signs.  No groin pain with internal X rotation of the leg.  No  paresthesias L1 S1 bilaterally.  No trochanteric tenderness is noted.  Regarding the right knee she has stable collateral cruciate ligaments no effusion full range of motion intact extensor mechanism and tenderness over the medial epicondyle.  Specialty Comments:  No specialty comments available.  Imaging: No results found.   PMFS History: Patient Active Problem List   Diagnosis Date Noted  . Hypersomnolence 07/01/2019  . Bilateral hearing loss 05/04/2017  . Strain of muscle of right hip 09/14/2015  . Rectocele 08/01/2015  . 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 Avascular necrosis (HCC) 07/11/2015  . Cellulitis and abscess of upper arm and forearm, right 04/28/2013  . Chest pain at rest 03/03/2013  . HTN (hypertension) 03/03/2013   Past Medical History:  Diagnosis Date  . Anxiety   . Asthma   . Bronchitis    hx of  . Depression with anxiety   . Environmental allergies   . GERD (gastroesophageal reflux disease)   . History of nuclear stress test 03/08/2013   lexiscan; normal study; EF 59%  . Hx of migraines   . Hypertension   . Osteoporosis   . Seasonal allergies     Family History  Problem Relation Age of Onset  . Cancer - Lung Mother   . Cancer - Colon Father   . Healthy Brother   . Cancer - Other Maternal Grandmother 80       brain cancer from melonoma  . Cancer - Other Maternal Grandfather 80       stomach cancer  . Cancer - Lung Paternal Grandfather     Past Surgical History:  Procedure Laterality Date  . ABDOMINAL HYSTERECTOMY    . I & D EXTREMITY Right 04/28/2013   Procedure: IRRIGATION AND DEBRIDEMENT RIGHT WRIST/FOREARM;  Surgeon: Kathryne Hitch, MD;  Location: [facility] OR;  Service: Orthopedics;  Laterality: Right;  . INCISION AND DRAINAGE FOREARM / WRIST DEEP Right 04/28/2013   Dr Magnus Ivan  . ROTATOR CUFF REPAIR Bilateral   . TOTAL HIP ARTHROPLASTY Left   . WRIST SURGERY Right    plate and screws in wrist   Social History    Occupational History  . Not on file  Tobacco Use  . Smoking status: Never Smoker  . Smokeless tobacco: Never Used  Substance and Sexual Activity  . Alcohol use: Yes    Comment: social  . Drug use: No  . Sexual activity: Not on file

## 2020-07-17 DIAGNOSIS — D225 Melanocytic nevi of trunk: Secondary | ICD-10-CM | POA: Diagnosis not present

## 2020-07-17 DIAGNOSIS — L853 Xerosis cutis: Secondary | ICD-10-CM | POA: Diagnosis not present

## 2020-07-17 DIAGNOSIS — L700 Acne vulgaris: Secondary | ICD-10-CM | POA: Diagnosis not present

## 2020-07-17 DIAGNOSIS — L821 Other seborrheic keratosis: Secondary | ICD-10-CM | POA: Diagnosis not present

## 2020-08-06 ENCOUNTER — Ambulatory Visit: Payer: Federal, State, Local not specified - PPO | Admitting: Physical Medicine and Rehabilitation

## 2020-08-06 ENCOUNTER — Ambulatory Visit: Payer: Self-pay

## 2020-08-06 ENCOUNTER — Encounter: Payer: Self-pay | Admitting: Physical Medicine and Rehabilitation

## 2020-08-06 ENCOUNTER — Other Ambulatory Visit: Payer: Self-pay

## 2020-08-06 VITALS — BP 123/80 | HR 96

## 2020-08-06 DIAGNOSIS — G8929 Other chronic pain: Secondary | ICD-10-CM

## 2020-08-06 DIAGNOSIS — M47816 Spondylosis without myelopathy or radiculopathy, lumbar region: Secondary | ICD-10-CM

## 2020-08-06 DIAGNOSIS — M542 Cervicalgia: Secondary | ICD-10-CM | POA: Diagnosis not present

## 2020-08-06 DIAGNOSIS — M545 Low back pain, unspecified: Secondary | ICD-10-CM | POA: Diagnosis not present

## 2020-08-06 DIAGNOSIS — M47812 Spondylosis without myelopathy or radiculopathy, cervical region: Secondary | ICD-10-CM

## 2020-08-06 DIAGNOSIS — M797 Fibromyalgia: Secondary | ICD-10-CM

## 2020-08-06 DIAGNOSIS — M79672 Pain in left foot: Secondary | ICD-10-CM

## 2020-08-06 MED ORDER — METHYLPREDNISOLONE ACETATE 80 MG/ML IJ SUSP
80.0000 mg | Freq: Once | INTRAMUSCULAR | Status: AC
  Administered 2020-08-06: 80 mg

## 2020-08-06 NOTE — Procedures (Signed)
Lumbar Facet Joint Intra-Articular Injection(s) with Fluoroscopic Guidance  Patient: Cynthia Gordon      Date of Birth: April 05, 1961 MRN: [id number] PCP: Merri Brunette, MD      Visit Date: 08/06/2020   Universal Protocol:    Date/Time: 08/06/2020  Consent Given By: the patient  Position: PRONE   Additional Comments: Vital signs were monitored before and after the procedure. Patient was prepped and draped in the usual sterile fashion. The correct patient, procedure, and site was verified.   Injection Procedure Details:  Procedure Site One Meds Administered:  Meds ordered this encounter  Medications  . methylPREDNISolone acetate (DEPO-MEDROL) injection 80 mg     Laterality: Bilateral  Location/Site:  L4-L5  Needle size: 22 guage  Needle type: Spinal  Needle Placement: Articular  Findings:  -Comments: Excellent flow of contrast producing a partial arthrogram.  Procedure Details: The fluoroscope beam is vertically oriented in AP, and the inferior recess is visualized beneath the lower pole of the inferior apophyseal process, which represents the target point for needle insertion. When direct visualization is difficult the target point is located at the medial projection of the vertebral pedicle. The region overlying each aforementioned target is locally anesthetized with a 1 to 2 ml. volume of 1% Lidocaine without Epinephrine.   The spinal needle was inserted into each of the above mentioned facet joints using biplanar fluoroscopic guidance. A 0.25 to 0.5 ml. volume of Isovue-250 was injected and a partial facet joint arthrogram was obtained. A single spot film was obtained of the resulting arthrogram.    One to 1.25 ml of the steroid/anesthetic solution was then injected into each of the facet joints noted above.   Additional Comments:  The patient tolerated the procedure well Dressing: 2 x 2 sterile gauze and Band-Aid    Post-procedure details: Patient was  observed during the procedure. Post-procedure instructions were reviewed.  Patient left the clinic in stable condition.

## 2020-08-06 NOTE — Progress Notes (Signed)
Pt state lower back pain. Pt state bending over or any kind of house cleaning makes the pain worse. Pt state she sits and rest and takes pain meds to help ease the pain.  Numeric Pain Rating Scale and Functional Assessment Average Pain 7   In the last MONTH (on 0-10 scale) has pain interfered with the following?  1. General activity like being  able to carry out your everyday physical activities such as walking, climbing stairs, carrying groceries, or moving a chair?  Rating(10)   +Driver, -BT, -Dye Allergies.

## 2020-08-07 ENCOUNTER — Encounter: Payer: Self-pay | Admitting: Physical Medicine and Rehabilitation

## 2020-08-07 DIAGNOSIS — M797 Fibromyalgia: Secondary | ICD-10-CM | POA: Insufficient documentation

## 2020-08-07 DIAGNOSIS — G8929 Other chronic pain: Secondary | ICD-10-CM | POA: Insufficient documentation

## 2020-08-07 DIAGNOSIS — M47812 Spondylosis without myelopathy or radiculopathy, cervical region: Secondary | ICD-10-CM | POA: Insufficient documentation

## 2020-08-07 DIAGNOSIS — M545 Low back pain, unspecified: Secondary | ICD-10-CM | POA: Insufficient documentation

## 2020-08-07 DIAGNOSIS — M47816 Spondylosis without myelopathy or radiculopathy, lumbar region: Secondary | ICD-10-CM | POA: Insufficient documentation

## 2020-08-07 NOTE — Progress Notes (Signed)
Cynthia Gordon - 59 y.o. female MRN [id number]  Date of birth: 11/18/1960  Office Visit Note: Visit Date: 08/06/2020 PCP: Cynthia Gordon, Walter, MD Referred by: Cynthia Gordon, Walter, MD  Subjective: Chief Complaint  Patient presents with  . Lower Back - Pain   HPI: Cynthia Gordon is a 59 y.o. female who comes in today For potential planned repeat intra-articular facet joint blocks at L4-5 with history of chronic worsening severe axial back pain.  She comes in today at the request of Cynthia Gordon who manages her from an orthopedic standpoint.  She complains also of left foot pain with history of MRI evidence of stress reaction.  She also complains of chronic worsening severe neck pain and cervicalgia.  In terms of her lower back she has had physical therapy and core strengthening and continues to try to stay active.  She has not had recent physical therapy.  We have completed injection in 2016 I believe has been quite a while since we have completed injection in her spine.  At that time she did have evidence of facet arthropathy at L4-5 quite significant on MRI.  She has had updated MRI since I have seen her and this is reviewed below and reviewed with her today.  No high-grade stenosis or listhesis.  She does have facet arthropathy particular at L4-5.  She has had no focal trauma although she had a fall earlier in the year and since that fall she has had increasing pain in her neck back and foot.  She has had hip replacement she has had multiple orthopedic complaints.  She has had no radicular pain or paresthesias.  In terms of her neck pain she tells me today that she had had a referral from her primary physician Cynthia Gordon to me at some point but never saw me for her neck.  She reports mostly axial neck pain worse with rotation and extension.  She reports somebody telling her one time she had a lot of arthritis in her neck.  She has not had full evaluation of her neck by Cynthia Gordon.  She has had  shoulder pain in the past.  No radicular symptoms into the hands.  She tells me that she was diagnosed formally with fibromyalgia.  She has no associated headaches etc.  No prior neck surgery.  In terms of her left foot this was thoroughly evaluated and managed by Cynthia Gordon.  According to his last note he wanted her to be seen by Cynthia Gordon for evaluation.  The patient states that was 4 weeks ago when she is not heard from anybody in that office.  I did look up in our chart and there was a message sent to the front desk to get an appointment with Cynthia Gordon but it looks like nothing was ever made and there is no documentation of a call.  Review of Systems  Musculoskeletal: Positive for back pain, joint pain and neck pain.  All other systems reviewed and are negative.  Otherwise per HPI.  Assessment & Plan: Visit Diagnoses:    ICD-10-CM   1. Spondylosis without myelopathy or radiculopathy, lumbar region  M47.816 XR C-ARM NO REPORT    Facet Injection    methylPREDNISolone acetate (DEPO-MEDROL) injection 80 mg  2. Chronic bilateral low back pain without sciatica  M54.50    G89.29   3. Cervicalgia  M54.2   4. Cervical spondylosis without myelopathy  M47.812   5. Chronic foot pain, left  M79.672    G89.29   6. Fibromyalgia  M79.7      Plan: Findings:  1.  Chronic worsening severe axial low back pain consistent with facet mediated low back pain with underlying fibromyalgia and myofascial pain.  She has done extremely well in the past with intra-articular facet joint blocks.  We elected to repeat that today given the amount of pain she is having.  Consideration given to medial branch blocks and potential for radiofrequency ablation.  Also likely would need to regroup with physical therapy at some point we may have to combine that with her neck as shown below.  Injection of the lumbar spine was performed today.  2..  Chronic worsening axial neck pain likely myofascial and fibromyalgia but also  probable underlying spondylosis is seen in her lower back and her other joints.  I am going to have her come back to see [place] for a full office visit that this really has not been worked up in general and she did have a prior referral with [place].  She will continue with current medications.  3.  Worsening chronic left foot pain with MRI evidence of stress reaction thoroughly managed by Dr. August Gordon but appears that appointment with Dr. Aldean Baker was never made.  We did make that appointment for her today.      Meds & Orders:  Meds ordered this encounter  Medications  . methylPREDNISolone acetate (DEPO-MEDROL) injection 80 mg    Orders Placed This Encounter  Procedures  . Facet Injection  . XR C-ARM NO REPORT    Follow-up: No follow-ups on file.   Procedures: No procedures performed  Lumbar Facet Joint Intra-Articular Injection(s) with Fluoroscopic Guidance  Patient: Cynthia Gordon      Date of Birth: 1961-06-09 MRN: [id number] PCP: Cynthia Brunette, MD      Visit Date: 08/06/2020   Universal Protocol:    Date/Time: 08/06/2020  Consent Given By: the patient  Position: PRONE   Additional Comments: Vital signs were monitored before and after the procedure. Patient was prepped and draped in the usual sterile fashion. The correct patient, procedure, and site was verified.   Injection Procedure Details:  Procedure Site One Meds Administered:  Meds ordered this encounter  Medications  . methylPREDNISolone acetate (DEPO-MEDROL) injection 80 mg     Laterality: Bilateral  Location/Site:  L4-L5  Needle size: 22 guage  Needle type: Spinal  Needle Placement: Articular  Findings:  -Comments: Excellent flow of contrast producing a partial arthrogram.  Procedure Details: The fluoroscope beam is vertically oriented in AP, and the inferior recess is visualized beneath the lower pole of the inferior apophyseal process, which represents the target point for needle insertion.  When direct visualization is difficult the target point is located at the medial projection of the vertebral pedicle. The region overlying each aforementioned target is locally anesthetized with a 1 to 2 ml. volume of 1% Lidocaine without Epinephrine.   The spinal needle was inserted into each of the above mentioned facet joints using biplanar fluoroscopic guidance. A 0.25 to 0.5 ml. volume of Isovue-250 was injected and a partial facet joint arthrogram was obtained. A single spot film was obtained of the resulting arthrogram.    One to 1.25 ml of the steroid/anesthetic solution was then injected into each of the facet joints noted above.   Additional Comments:  The patient tolerated the procedure well Dressing: 2 x 2 sterile gauze and Band-Aid    Post-procedure details: Patient was observed during  the procedure. Post-procedure instructions were reviewed.  Patient left the clinic in stable condition.     Clinical History: MRI LUMBAR SPINE WITHOUT CONTRAST  TECHNIQUE: Multiplanar, multisequence MR imaging of the lumbar spine was performed. No intravenous contrast was administered.  COMPARISON:  Multiple exams, including 03/09/2015  FINDINGS: Segmentation: The lowest lumbar type non-rib-bearing vertebra is labeled as L5.  Alignment:  No vertebral subluxation is observed.  Vertebrae: 1.2 cm hemangioma centrally in the L3 vertebral body. No significant vertebral marrow edema is identified.  Conus medullaris: Extends to the L1 level and appears normal.  Paraspinal and other soft tissues: Unremarkable  Disc levels:  L1-2: Unremarkable.  L2- 3: Unremarkable.  L3-4:  No impingement.  Mild disc bulge.  L4-5: Borderline right subarticular lateral recess stenosis due to facet arthropathy and disc bulge. Small bilateral synovial cysts extend posterior from both facet joints, not causing impingement.  L5-S1: Borderline left subarticular lateral recess stenosis due  to facet arthropathy and disc bulge.  IMPRESSION: 1. Lumbar spondylosis and degenerative disc disease, but with only borderline impingement at the L4-5 and L5-S1 levels. 2. Multilevel facet arthropathy most notable at L4-5, with small synovial cyst extending posterior to the facet joints at this level bilaterally.   Electronically Signed   By: Gaylyn Rong M.D.   On: 04/20/2016 16:05   She reports that she has never smoked. She has never used smokeless tobacco. No results for input(s): HGBA1C, LABURIC in the last 8760 hours.  Objective:  VS:  HT:    WT:   BMI:     BP:123/80  HR:96bpm  TEMP: ( )  RESP:  Physical Exam Constitutional:      General: She is not in acute distress.    Appearance: Normal appearance. She is normal weight. She is not ill-appearing.  HENT:     Head: Normocephalic and atraumatic.     Right Ear: External ear normal.     Left Ear: External ear normal.  Eyes:     Extraocular Movements: Extraocular movements intact.  Cardiovascular:     Rate and Rhythm: Normal rate.     Pulses: Normal pulses.  Musculoskeletal:        General: Tenderness present. No signs of injury.     Cervical back: Neck supple. Tenderness present. No rigidity.     Right lower leg: No edema.     Left lower leg: No edema.     Comments: Patient has good distal strength with no pain over the greater trochanters.  No clonus or focal weakness.Patient somewhat slow to rise from a seated position to full extension.  There is concordant low back pain with facet loading and lumbar spine extension rotation.  There are no definitive trigger points but the patient is somewhat tender across the lower back and PSIS.  There is no pain with hip rotation.   Skin:    Findings: No erythema, lesion or rash.  Neurological:     General: No focal deficit present.     Mental Status: She is alert and oriented to person, place, and time.     Sensory: No sensory deficit.     Motor: No weakness or  abnormal muscle tone.     Coordination: Coordination normal.     Gait: Gait normal.  Psychiatric:        Mood and Affect: Mood normal.        Behavior: Behavior normal.     Ortho Exam  Imaging: XR C-ARM NO REPORT  Result Date:  08/06/2020 Please see Notes tab for imaging impression.   Past Medical/Family/Surgical/Social History: Medications & Allergies reviewed per EMR, new medications updated. Patient Active Problem List   Diagnosis Date Noted  . Fibromyalgia 08/07/2020  . Chronic bilateral low back pain without sciatica 08/07/2020  . Spondylosis without myelopathy or radiculopathy, lumbar region 08/07/2020  . Cervical spondylosis without myelopathy 08/07/2020  . Hypersomnolence 07/01/2019  . Bilateral hearing loss 05/04/2017  . Strain of muscle of right hip 09/14/2015  . Rectocele 08/01/2015  . 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 Avascular necrosis (HCC) 07/11/2015  . Cellulitis and abscess of upper arm and forearm, right 04/28/2013  . Chest pain at rest 03/03/2013  . HTN (hypertension) 03/03/2013   Past Medical History:  Diagnosis Date  . Anxiety   . Asthma   . Bronchitis    hx of  . Depression with anxiety   . Environmental allergies   . GERD (gastroesophageal reflux disease)   . History of nuclear stress test 03/08/2013   lexiscan; normal study; EF 59%  . Hx of migraines   . Hypertension   . Osteoporosis   . Seasonal allergies    Family History  Problem Relation Age of Onset  . Cancer - Lung Mother   . Cancer - Colon Father   . Healthy Brother   . Cancer - Other Maternal Grandmother 80       brain cancer from melonoma  . Cancer - Other Maternal Grandfather 80       stomach cancer  . Cancer - Lung Paternal Grandfather    Past Surgical History:  Procedure Laterality Date  . ABDOMINAL HYSTERECTOMY    . I & D EXTREMITY Right 04/28/2013   Procedure: IRRIGATION AND DEBRIDEMENT RIGHT WRIST/FOREARM;  Surgeon: Kathryne Hitch, MD;   Location: [facility] OR;  Service: Orthopedics;  Laterality: Right;  . INCISION AND DRAINAGE FOREARM / WRIST DEEP Right 04/28/2013   Dr Magnus Ivan  . ROTATOR CUFF REPAIR Bilateral   . TOTAL HIP ARTHROPLASTY Left   . WRIST SURGERY Right    plate and screws in wrist   Social History   Occupational History  . Not on file  Tobacco Use  . Smoking status: Never Smoker  . Smokeless tobacco: Never Used  Substance and Sexual Activity  . Alcohol use: Yes    Comment: social  . Drug use: No  . Sexual activity: Not on file

## 2020-08-09 ENCOUNTER — Encounter: Payer: Self-pay | Admitting: Orthopedic Surgery

## 2020-08-09 ENCOUNTER — Ambulatory Visit: Payer: Federal, State, Local not specified - PPO | Admitting: Orthopedic Surgery

## 2020-08-09 DIAGNOSIS — M25572 Pain in left ankle and joints of left foot: Secondary | ICD-10-CM | POA: Diagnosis not present

## 2020-08-09 MED ORDER — LIDOCAINE HCL 1 % IJ SOLN
2.0000 mL | INTRAMUSCULAR | Status: AC | PRN
  Administered 2020-08-09: 2 mL

## 2020-08-09 MED ORDER — METHYLPREDNISOLONE ACETATE 40 MG/ML IJ SUSP
40.0000 mg | INTRAMUSCULAR | Status: AC | PRN
  Administered 2020-08-09: 40 mg via INTRA_ARTICULAR

## 2020-08-09 NOTE — Progress Notes (Signed)
Office Visit Note   Patient: Cynthia Gordon           Date of Birth: 07/09/1961           MRN: [id number] Visit Date: 08/09/2020              Requested by: Merri Brunette, MD [address] [place],  [place] [address] PCP: Merri Brunette, MD  Chief Complaint  Patient presents with  . Left Ankle - Follow-up      HPI: Patient is a 59 year old woman who states that she fell down 5 steps in July she has undergone excellent prolonged conservative therapy most recently she has had a MRI scan October 15.  She states she has had 2 recent epidural steroid injections with Dr. Alvester Morin.  She is status post a free fibular graft at [facility] and after failure and onto a total hip arthroplasty at [facility] as well.  Assessment & Plan: Visit Diagnoses:  1. Pain in left ankle and joints of left foot     Plan: The ankle joint was injected she tolerated this well reevaluate in 4 weeks.  Patient may have inflammatory tissue within the joint causing impingement which may improve with injection or possible arthroscopy.  Follow-Up Instructions: Return in about 4 weeks (around 09/06/2020).   Ortho Exam  Patient is alert, oriented, no adenopathy, well-dressed, normal affect, normal respiratory effort. Examination patient has good pulses she has good ankle good subtalar motion she is tender to palpation over the anterior tibiofibular ligament.  She also has some pain laterally over the ankle pain with palpation of the syndesmosis pain with compression of the tibia and fibula consistent with syndesmotic injury as well.  The MRI scan is reviewed which shows some inflammation of the anterior process of the calcaneus.  Patient does not have as much symptoms at this location to palpation as she does with the syndesmosis and the anterior talofibular ligament.  Imaging: No results found. No images are attached to the encounter.  Labs: Lab Results  Component Value Date   HGBA1C 5.8 (H) 06/03/2018    REPTSTATUS 05/03/2013 FINAL 04/28/2013   REPTSTATUS 05/02/2013 FINAL 04/28/2013   REPTSTATUS 06/10/2013 FINAL 04/28/2013   GRAMSTAIN  04/28/2013    FEW WBC PRESENT, PREDOMINANTLY PMN NO SQUAMOUS EPITHELIAL CELLS SEEN NO ORGANISMS SEEN Performed at [facility]   GRAMSTAIN  04/28/2013    NO WBC SEEN NO SQUAMOUS EPITHELIAL CELLS SEEN NO ORGANISMS SEEN Performed at [facility]   CULT  04/28/2013    NO ANAEROBES ISOLATED Performed at [facility]   CULT  04/28/2013    NO GROWTH 3 DAYS Performed at [facility]   CULT  04/28/2013    NO ACID FAST BACILLI ISOLATED IN 6 WEEKS Performed at [facility]     Lab Results  Component Value Date   ALBUMIN 5.3 06/03/2018   ALBUMIN 4.2 01/11/2016   ALBUMIN 4.6 02/28/2013    No results found for: MG Lab Results  Component Value Date   VD25OH 44.0 06/03/2018    No results found for: PREALBUMIN CBC EXTENDED Latest Ref Rng & Units 08/18/2016 01/11/2016 04/28/2013  WBC 4.0 - 10.5 K/uL 6.1 4.5 5.3  RBC 3.87 - 5.11 MIL/uL 4.30 4.21 4.05  HGB 12.0 - 15.0 g/dL 25.8 11.8(L) 12.4  HCT 36 - 46 % 38.3 37.8 35.0(L)  PLT 150 - 400 K/uL 341 307 260     There is no height or weight on  file to calculate BMI.  Orders:  No orders of the defined types were placed in this encounter.  No orders of the defined types were placed in this encounter.    Procedures: Medium Joint Inj: L ankle on 08/09/2020 10:19 AM Indications: pain and diagnostic evaluation Details: 22 G 1.5 in needle, anteromedial approach Medications: 2 mL lidocaine 1 %; 40 mg methylPREDNISolone acetate 40 MG/ML Outcome: tolerated well, no immediate complications Procedure, treatment alternatives, risks and benefits explained, specific risks discussed. Consent was given by the patient. Immediately prior to procedure a time out was called to verify the correct patient, procedure, equipment, support staff and site/side marked as required. Patient  was prepped and draped in the usual sterile fashion.      Clinical Data: No additional findings.  ROS:  All other systems negative, except as noted in the HPI. Review of Systems  Objective: Vital Signs: There were no vitals taken for this visit.  Specialty Comments:  No specialty comments available.  PMFS History: Patient Active Problem List   Diagnosis Date Noted  . Fibromyalgia 08/07/2020  . Chronic bilateral low back pain without sciatica 08/07/2020  . Spondylosis without myelopathy or radiculopathy, lumbar region 08/07/2020  . Cervical spondylosis without myelopathy 08/07/2020  . Hypersomnolence 07/01/2019  . Bilateral hearing loss 05/04/2017  . Strain of muscle of right hip 09/14/2015  . Rectocele 08/01/2015  . 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 Avascular necrosis (HCC) 07/11/2015  . Cellulitis and abscess of upper arm and forearm, right 04/28/2013  . Chest pain at rest 03/03/2013  . HTN (hypertension) 03/03/2013   Past Medical History:  Diagnosis Date  . Anxiety   . Asthma   . Bronchitis    hx of  . Depression with anxiety   . Environmental allergies   . GERD (gastroesophageal reflux disease)   . History of nuclear stress test 03/08/2013   lexiscan; normal study; EF 59%  . Hx of migraines   . Hypertension   . Osteoporosis   . Seasonal allergies     Family History  Problem Relation Age of Onset  . Cancer - Lung Mother   . Cancer - Colon Father   . Healthy Brother   . Cancer - Other Maternal Grandmother 80       brain cancer from melonoma  . Cancer - Other Maternal Grandfather 80       stomach cancer  . Cancer - Lung Paternal Grandfather     Past Surgical History:  Procedure Laterality Date  . ABDOMINAL HYSTERECTOMY    . I & D EXTREMITY Right 04/28/2013   Procedure: IRRIGATION AND DEBRIDEMENT RIGHT WRIST/FOREARM;  Surgeon: Kathryne Hitch, MD;  Location: [facility] OR;  Service: Orthopedics;  Laterality: Right;  . INCISION AND  DRAINAGE FOREARM / WRIST DEEP Right 04/28/2013   Dr Magnus Ivan  . ROTATOR CUFF REPAIR Bilateral   . TOTAL HIP ARTHROPLASTY Left   . WRIST SURGERY Right    plate and screws in wrist   Social History   Occupational History  . Not on file  Tobacco Use  . Smoking status: Never Smoker  . Smokeless tobacco: Never Used  Substance and Sexual Activity  . Alcohol use: Yes    Comment: social  . Drug use: No  . Sexual activity: Not on file

## 2020-08-20 ENCOUNTER — Encounter: Payer: Self-pay | Admitting: Primary Care

## 2020-08-20 ENCOUNTER — Ambulatory Visit: Payer: Federal, State, Local not specified - PPO | Admitting: Primary Care

## 2020-08-20 ENCOUNTER — Other Ambulatory Visit: Payer: Self-pay

## 2020-08-20 VITALS — BP 128/70 | HR 90 | Temp 98.3°F | Ht 63.0 in | Wt 161.0 lb

## 2020-08-20 DIAGNOSIS — Z9989 Dependence on other enabling machines and devices: Secondary | ICD-10-CM

## 2020-08-20 DIAGNOSIS — G4733 Obstructive sleep apnea (adult) (pediatric): Secondary | ICD-10-CM

## 2020-08-20 NOTE — Assessment & Plan Note (Addendum)
Patient has mild OSA, she was started on Auto CPAP 5-15cm h20 back in January 2021, She had difficulty getting a mask to fit correctly. Advised patient to resume CPAP use, aim to wear every night for minimum 4-6 hours or more. DME company is [employer].   Orders: - Renew auto CPAP 5-15 cm h20, supplies, chin strap, enroll in airview   Follow-up: - 4 weeks televisit for compliance check. If still having difficulty recommend mask desensitization study.

## 2020-08-20 NOTE — Patient Instructions (Addendum)
Recommendations: - Resume CPAP, aim to wear every night for minimum 4-6 hours or more  - Do not drive if drowsy   Orders: - Continue auto CPAP 5-15 cm h20, supplies, chin strap, enroll in airview   Follow-up: - 4 weeks televisit    CPAP and BPAP Information CPAP and BPAP are methods of helping a person breathe with the use of air pressure. CPAP stands for "continuous positive airway pressure." BPAP stands for "bi-level positive airway pressure." In both methods, air is blown through your nose or mouth and into your air passages to help you breathe well. CPAP and BPAP use different amounts of pressure to blow air. With CPAP, the amount of pressure stays the same while you breathe in and out. With BPAP, the amount of pressure is increased when you breathe in (inhale) so that you can take larger breaths. Your health care provider will recommend whether CPAP or BPAP would be more helpful for you. Why are CPAP and BPAP treatments used? CPAP or BPAP can be helpful if you have:  Sleep apnea.  Chronic obstructive pulmonary disease (COPD).  Heart failure.  Medical conditions that weaken the muscles of the chest including muscular dystrophy, or neurological diseases such as amyotrophic lateral sclerosis (ALS).  Other problems that cause breathing to be weak, abnormal, or difficult. CPAP is most commonly used for obstructive sleep apnea (OSA) to keep the airways from collapsing when the muscles relax during sleep. How is CPAP or BPAP administered? Both CPAP and BPAP are provided by a small machine with a flexible plastic tube that attaches to a plastic mask. You wear the mask. Air is blown through the mask into your nose or mouth. The amount of pressure that is used to blow the air can be adjusted on the machine. Your health care provider will determine the pressure setting that should be used based on your individual needs. When should CPAP or BPAP be used? In most cases, the mask only needs to  be worn during sleep. Generally, the mask needs to be worn throughout the night and during any daytime naps. People with certain medical conditions may also need to wear the mask at other times when they are awake. Follow instructions from your health care provider about when to use the machine. What are some tips for using the mask?   Because the mask needs to be snug, some people feel trapped or closed-in (claustrophobic) when first using the mask. If you feel this way, you may need to get used to the mask. One way to do this is by holding the mask loosely over your nose or mouth and then gradually applying the mask more snugly. You can also gradually increase the amount of time that you use the mask.  Masks are available in various types and sizes. Some fit over your mouth and nose while others fit over just your nose. If your mask does not fit well, talk with your health care provider about getting a different one.  If you are using a mask that fits over your nose and you tend to breathe through your mouth, a chin strap may be applied to help keep your mouth closed.  The CPAP and BPAP machines have alarms that may sound if the mask comes off or develops a leak.  If you have trouble with the mask, it is very important that you talk with your health care provider about finding a way to make the mask easier to tolerate. Do not  stop using the mask. Stopping the use of the mask could have a negative impact on your health. What are some tips for using the machine?  Place your CPAP or BPAP machine on a secure table or stand near an electrical outlet.  Know where the on/off switch is located on the machine.  Follow instructions from your health care provider about how to set the pressure on your machine and when you should use it.  Do not eat or drink while the CPAP or BPAP machine is on. Food or fluids could get pushed into your lungs by the pressure of the CPAP or BPAP.  Do not smoke. Tobacco  smoke residue can damage the machine.  For home use, CPAP and BPAP machines can be rented or purchased through home health care companies. Many different brands of machines are available. Renting a machine before purchasing may help you find out which particular machine works well for you.  Keep the CPAP or BPAP machine and attachments clean. Ask your health care provider for specific instructions. Get help right away if:  You have redness or open areas around your nose or mouth where the mask fits.  You have trouble using the CPAP or BPAP machine.  You cannot tolerate wearing the CPAP or BPAP mask.  You have pain, discomfort, and bloating in your abdomen. Summary  CPAP and BPAP are methods of helping a person breathe with the use of air pressure.  Both CPAP and BPAP are provided by a small machine with a flexible plastic tube that attaches to a plastic mask.  If you have trouble with the mask, it is very important that you talk with your health care provider about finding a way to make the mask easier to tolerate. This information is not intended to replace advice given to you by your health care provider. Make sure you discuss any questions you have with your health care provider. Document Revised: 12/15/2018 Document Reviewed: 07/14/2016 Elsevier Patient Education  2020 [employer].

## 2020-08-20 NOTE — Progress Notes (Signed)
@Patient  ID: , female    DOB: 1961/07/16, 59 y.o.   MRN: [id number]  Chief Complaint  Patient presents with  . Follow-up    Reports not using CPAP. States she has had family issues and DME company issues over the past several months and has not been able to get CPAP supplies    Referring provider: [id number], MD  HPI: 59 old female, never smoked.  Past medical history significant of obstructive sleep apnea, hypertension, fibromyalgia, hypersomnolence.  Patient of Dr. 46, seen for initial consult on 07/01/2019.  Home sleep study showed mild OSA, AHI 6.4/hr with SpO2 low 77%. Started on auto CPAP 5-15cm h20 in January 2021. DME company is [employer].   08/20/2020 Presents today for follow-up OSA.  She has not been seen in office since starting CPAP therapy. Original order was placed back in January 2021 for auto CPAP 5-15cm h20. She does have a CPAP machine but is not currently using d/t mask issues. She originally tried nasal pillow mask but this caused her to have some nasal irritation. She was scheduled for an apt with DME store in [place] but the day she went to see them the office was closing. She was given a new mask but became very aggravated with the whole process. She is under a considerable about of stress with her brother's passing in July 2021. She does not sleep well at night. States that she goes cycles where she does not sleep at all and then crashes. She has recently pull her CPAP machine back out. She never tried the new mask she was given.     Allergies  Allergen Reactions  . Amoxicillin Rash  . Codeine Itching  . Demerol [Meperidine] Itching  . Morphine And Related Itching  . Penicillin G Rash    Has patient had a PCN reaction causing immediate rash, facial/tongue/throat swelling, SOB or lightheadedness with hypotension: Yes Has patient had a PCN reaction causing severe rash involving mucus membranes or skin necrosis: Yes Has patient had a PCN  reaction that required hospitalization No Has patient had a PCN reaction occurring within the last 10 years: No If all of the above answers are "NO", then may proceed with Cephalosporin use.   August 2021 [Sulfamethoxazole-Trimethoprim] Rash  . Tetracyclines & Related Rash    Immunization History  Administered Date(s) Administered  . Influenza-Unspecified 07/23/2017, 05/09/2018    Past Medical History:  Diagnosis Date  . Anxiety   . Asthma   . Bronchitis    hx of  . Depression with anxiety   . Environmental allergies   . GERD (gastroesophageal reflux disease)   . History of nuclear stress test 03/08/2013   lexiscan; normal study; EF 59%  . Hx of migraines   . Hypertension   . Osteoporosis   . Seasonal allergies     Tobacco History: Social History   Tobacco Use  Smoking Status Never Smoker  Smokeless Tobacco Never Used   Counseling given: Not Answered   Outpatient Medications Prior to Visit  Medication Sig Dispense Refill  . ALPRAZolam (XANAX XR) 1 MG 24 hr tablet Take 1 mg by mouth daily.    05/09/2013 atorvastatin (LIPITOR) 20 MG tablet Take 20 mg by mouth daily.    Marland Kitchen buPROPion (WELLBUTRIN XL) 300 MG 24 hr tablet Take 300 mg by mouth daily.    . cyclobenzaprine (FLEXERIL) 10 MG tablet Take 10 mg by mouth 2 (two) times daily as needed for muscle spasms.    Marland Kitchen  dexlansoprazole (DEXILANT) 60 MG capsule Take 60 mg by mouth daily.    . DULoxetine (CYMBALTA) 60 MG capsule Take 60 mg by mouth daily.  1  . topiramate (TOPAMAX) 100 MG tablet Take 100 mg by mouth daily.     . valsartan-hydrochlorothiazide (DIOVAN-HCT) 320-25 MG per tablet Take 1 tablet by mouth daily.    Marland Kitchen zolpidem (AMBIEN) 5 MG tablet Take 1 tablet by mouth at bedtime.     Facility-Administered Medications Prior to Visit  Medication Dose Route Frequency Provider Last Rate Last Admin  . ondansetron (ZOFRAN-ODT) disintegrating tablet 8 mg  8 mg Oral Once Ethelda Chick, MD       Review of Systems  Review of Systems   Constitutional: Positive for fatigue.  Respiratory: Negative.   Cardiovascular: Negative.    Physical Exam  BP 128/70   Pulse 90   Temp 98.3 F (36.8 C)   Ht 5\' 3"  (1.6 m)   Wt 161 lb (73 kg)   SpO2 97%   BMI 28.52 kg/m  Physical Exam Constitutional:      Appearance: Normal appearance.  HENT:     Mouth/Throat:     Mouth: Mucous membranes are moist.     Pharynx: Oropharynx is clear.  Cardiovascular:     Rate and Rhythm: Normal rate and regular rhythm.  Pulmonary:     Effort: Pulmonary effort is normal.     Breath sounds: Normal breath sounds. No wheezing or rhonchi.  Musculoskeletal:        General: Normal range of motion.  Skin:    General: Skin is warm and dry.  Neurological:     General: No focal deficit present.     Mental Status: She is alert and oriented to person, place, and time. Mental status is at baseline.  Psychiatric:        Mood and Affect: Mood normal.        Behavior: Behavior normal.        Thought Content: Thought content normal.        Judgment: Judgment normal.      Lab Results:  CBC    Component Value Date/Time   WBC 6.1 08/18/2016 0453   RBC 4.30 08/18/2016 0453   HGB 12.9 08/18/2016 0453   HCT 38.3 08/18/2016 0453   PLT 341 08/18/2016 0453   MCV 89.1 08/18/2016 0453   MCH 30.0 08/18/2016 0453   MCHC 33.7 08/18/2016 0453   RDW 13.4 08/18/2016 0453    BMET    Component Value Date/Time   NA 143 06/03/2018 1113   K 3.8 06/03/2018 1113   CL 101 06/03/2018 1113   CO2 26 06/03/2018 1113   GLUCOSE 89 06/03/2018 1113   GLUCOSE 116 (H) 08/18/2016 0453   BUN 14 06/03/2018 1113   CREATININE 1.08 (H) 06/03/2018 1113   CALCIUM 10.2 06/03/2018 1113   GFRNONAA 58 (L) 06/03/2018 1113   GFRAA 66 06/03/2018 1113    BNP No results found for: BNP  ProBNP    Component Value Date/Time   PROBNP 22.2 02/28/2013 2214    Imaging: XR C-ARM NO REPORT  Result Date: 08/06/2020 Please see Notes tab for imaging  impression.    Assessment & Plan:   OSA on CPAP Patient has mild OSA, she was started on Auto CPAP 5-15cm h20 back in January 2021, She had difficulty getting a mask to fit correctly. Advised patient to resume CPAP use, aim to wear every night for minimum 4-6 hours or more. DME  company is [employer].   Orders: - Renew auto CPAP 5-15 cm h20, supplies, chin strap, enroll in airview   Follow-up: - 4 weeks televisit for compliance check. If still having difficulty recommend mask desensitization study.    Glenford Bayley, NP 08/20/2020

## 2020-09-06 ENCOUNTER — Encounter: Payer: Self-pay | Admitting: Orthopedic Surgery

## 2020-09-06 ENCOUNTER — Ambulatory Visit: Payer: Federal, State, Local not specified - PPO | Admitting: Orthopedic Surgery

## 2020-09-06 DIAGNOSIS — M6702 Short Achilles tendon (acquired), left ankle: Secondary | ICD-10-CM

## 2020-09-06 DIAGNOSIS — M25572 Pain in left ankle and joints of left foot: Secondary | ICD-10-CM | POA: Diagnosis not present

## 2020-09-06 DIAGNOSIS — Z13820 Encounter for screening for osteoporosis: Secondary | ICD-10-CM | POA: Diagnosis not present

## 2020-09-06 NOTE — Progress Notes (Signed)
Office Visit Note   Patient: Cynthia Gordon           Date of Birth: September 17, 1960           MRN: [id number] Visit Date: 09/06/2020              Requested by: Merri Brunette, MD [address] [place],  [place] [address] PCP: Merri Brunette, MD  Chief Complaint  Patient presents with  . Left Ankle - Follow-up    08/09/20 s/p cortisone injection       HPI: Patient is a 59 year old woman is seen in follow-up for impingement symptoms left ankle. She states the intra-articular injection did help but she still has aching after prolonged ambulation primarily over the anterior lateral joint line but also has pain over the anterior medial joint line.  Assessment & Plan: Visit Diagnoses:  1. Pain in left ankle and joints of left foot   2. Achilles tendon contracture, left     Plan: Patient was given instructions and demonstrated Achilles stretching recommended topical Voltaren gel 3 times a day to the ankle reevaluate in 4 weeks discussed the possibility of arthroscopic intervention.  Follow-Up Instructions: Return in about 4 weeks (around 10/04/2020).   Ortho Exam  Patient is alert, oriented, no adenopathy, well-dressed, normal affect, normal respiratory effort. Examination patient has a good dorsalis pedis pulse she has pain to palpation over the anterior lateral and anterior medial joint line. She does have Achilles contracture with dorsiflexion 10 degrees short of neutral with her knee extended there is no redness no cellulitis.  Imaging: No results found. No images are attached to the encounter.  Labs: Lab Results  Component Value Date   HGBA1C 5.8 (H) 06/03/2018   REPTSTATUS 05/03/2013 FINAL 04/28/2013   REPTSTATUS 05/02/2013 FINAL 04/28/2013   REPTSTATUS 06/10/2013 FINAL 04/28/2013   GRAMSTAIN  04/28/2013    FEW WBC PRESENT, PREDOMINANTLY PMN NO SQUAMOUS EPITHELIAL CELLS SEEN NO ORGANISMS SEEN Performed at [facility]   GRAMSTAIN  04/28/2013     NO WBC SEEN NO SQUAMOUS EPITHELIAL CELLS SEEN NO ORGANISMS SEEN Performed at [facility]   CULT  04/28/2013    NO ANAEROBES ISOLATED Performed at [facility]   CULT  04/28/2013    NO GROWTH 3 DAYS Performed at [facility]   CULT  04/28/2013    NO ACID FAST BACILLI ISOLATED IN 6 WEEKS Performed at [facility]     Lab Results  Component Value Date   ALBUMIN 5.3 06/03/2018   ALBUMIN 4.2 01/11/2016   ALBUMIN 4.6 02/28/2013    No results found for: MG Lab Results  Component Value Date   VD25OH 44.0 06/03/2018    No results found for: PREALBUMIN CBC EXTENDED Latest Ref Rng & Units 08/18/2016 01/11/2016 04/28/2013  WBC 4.0 - 10.5 K/uL 6.1 4.5 5.3  RBC 3.87 - 5.11 MIL/uL 4.30 4.21 4.05  HGB 12.0 - 15.0 g/dL 93.2 11.8(L) 12.4  HCT 36.0 - 46.0 % 38.3 37.8 35.0(L)  PLT 150 - 400 K/uL 341 307 260     There is no height or weight on file to calculate BMI.  Orders:  No orders of the defined types were placed in this encounter.  No orders of the defined types were placed in this encounter.    Procedures: No procedures performed  Clinical Data: No additional findings.  ROS:  All other systems negative, except as noted in the HPI. Review of Systems  Objective:  Vital Signs: There were no vitals taken for this visit.  Specialty Comments:  No specialty comments available.  PMFS History: Patient Active Problem List   Diagnosis Date Noted  . OSA on CPAP 08/20/2020  . Fibromyalgia 08/07/2020  . Chronic bilateral low back pain without sciatica 08/07/2020  . Spondylosis without myelopathy or radiculopathy, lumbar region 08/07/2020  . Cervical spondylosis without myelopathy 08/07/2020  . Hypersomnolence 07/01/2019  . Bilateral hearing loss 05/04/2017  . Strain of muscle of right hip 09/14/2015  . Rectocele 08/01/2015  . 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 Avascular necrosis (HCC) 07/11/2015  . Cellulitis and  abscess of upper arm and forearm, right 04/28/2013  . Chest pain at rest 03/03/2013  . HTN (hypertension) 03/03/2013   Past Medical History:  Diagnosis Date  . Anxiety   . Asthma   . Bronchitis    hx of  . Depression with anxiety   . Environmental allergies   . GERD (gastroesophageal reflux disease)   . History of nuclear stress test 03/08/2013   lexiscan; normal study; EF 59%  . Hx of migraines   . Hypertension   . Osteoporosis   . Seasonal allergies     Family History  Problem Relation Age of Onset  . Cancer - Lung Mother   . Cancer - Colon Father   . Healthy Brother   . Cancer - Other Maternal Grandmother 80       brain cancer from melonoma  . Cancer - Other Maternal Grandfather 80       stomach cancer  . Cancer - Lung Paternal Grandfather     Past Surgical History:  Procedure Laterality Date  . ABDOMINAL HYSTERECTOMY    . I & D EXTREMITY Right 04/28/2013   Procedure: IRRIGATION AND DEBRIDEMENT RIGHT WRIST/FOREARM;  Surgeon: Kathryne Hitch, MD;  Location: [facility] OR;  Service: Orthopedics;  Laterality: Right;  . INCISION AND DRAINAGE FOREARM / WRIST DEEP Right 04/28/2013   Dr Magnus Ivan  . ROTATOR CUFF REPAIR Bilateral   . TOTAL HIP ARTHROPLASTY Left   . WRIST SURGERY Right    plate and screws in wrist   Social History   Occupational History  . Not on file  Tobacco Use  . Smoking status: Never Smoker  . Smokeless tobacco: Never Used  Substance and Sexual Activity  . Alcohol use: Yes    Comment: social  . Drug use: No  . Sexual activity: Not on file

## 2020-09-11 DIAGNOSIS — H903 Sensorineural hearing loss, bilateral: Secondary | ICD-10-CM | POA: Diagnosis not present

## 2020-09-11 DIAGNOSIS — H9313 Tinnitus, bilateral: Secondary | ICD-10-CM | POA: Diagnosis not present

## 2020-09-19 ENCOUNTER — Ambulatory Visit: Payer: Federal, State, Local not specified - PPO | Admitting: Physical Medicine and Rehabilitation

## 2020-09-19 ENCOUNTER — Encounter: Payer: Self-pay | Admitting: Physical Medicine and Rehabilitation

## 2020-09-19 ENCOUNTER — Other Ambulatory Visit: Payer: Self-pay

## 2020-09-19 VITALS — BP 106/74 | HR 93

## 2020-09-19 DIAGNOSIS — M797 Fibromyalgia: Secondary | ICD-10-CM

## 2020-09-19 DIAGNOSIS — M47812 Spondylosis without myelopathy or radiculopathy, cervical region: Secondary | ICD-10-CM | POA: Diagnosis not present

## 2020-09-19 DIAGNOSIS — M542 Cervicalgia: Secondary | ICD-10-CM | POA: Diagnosis not present

## 2020-09-19 DIAGNOSIS — G8929 Other chronic pain: Secondary | ICD-10-CM

## 2020-09-19 DIAGNOSIS — R202 Paresthesia of skin: Secondary | ICD-10-CM

## 2020-09-19 DIAGNOSIS — M25511 Pain in right shoulder: Secondary | ICD-10-CM

## 2020-09-19 DIAGNOSIS — M25512 Pain in left shoulder: Secondary | ICD-10-CM

## 2020-09-19 NOTE — Progress Notes (Signed)
Neck, shoulder, and arm pain. Numbness in hands- notices numbness more in left.  Numeric Pain Rating Scale and Functional Assessment Average Pain 10 Pain Right Now 9 My pain is constant, dull, tingling and aching Pain is worse with: vacuuming, taking down Christmas decorations, carrying boxes, brushing horses- anything where arms are over her head Pain improves with: nothing   In the last MONTH (on 0-10 scale) has pain interfered with the following?  1. General activity like being  able to carry out your everyday physical activities such as walking, climbing stairs, carrying groceries, or moving a chair?  Rating(10)  2. Relation with others like being able to carry out your usual social activities and roles such as  activities at home, at work and in your community. Rating(10)  3. Enjoyment of life such that you have  been bothered by emotional problems such as feeling anxious, depressed or irritable?  Rating(10)

## 2020-09-19 NOTE — Progress Notes (Signed)
Cynthia Butteryeresa Cooper Shiveley - 60 y.o. female MRN [id number]  Date of birth: 09/15/1960  Office Visit Note: Visit Date: 09/19/2020 PCP: Merri BrunettePharr, Walter, MD Referred by: Merri BrunettePharr, Walter, MD  Subjective: Chief Complaint  Patient presents with   Neck - Pain   HPI: Cynthia Gordon is a 60 y.o. female who comes in today Really as a self-referral but there was a prior referral from Dr. Merri BrunetteWalter Pharr for evaluation and management of chronic severe recalcitrant neck pain, bilateral shoulder pain with nondermatomal paresthesia and numbness.  I have seen the patient in the past for lumbar spine injections which were mostly facet arthropathy and facet joint blocks.  She is followed in the office from an orthopedic standpoint by Dr. Burnard BuntingG. Scott Dean as well as Dr. Aldean BakerMarcus Duda.  She is seen for knee problems and problems with her foot.  In terms of her neck pain she tells me today that she has mostly axial neck pain worse with rotation and extension.  She reports somebody telling her one time she had a lot of arthritis in her neck.  She has not had full evaluation of her neck by Dr. August Saucerean.  She has had shoulder pain in the past.  No radicular symptoms into the hands.  She tells me that she was diagnosed formally with fibromyalgia.  She has no associated headaches etc.  No prior neck surgery.  She describes 10 out of 10 constant dull pain with tingling and aching into the arms and hands.  She reports worsening with household chores such as vacuuming and taking down decorations and redecorating.  She has had some problems with carrying boxes and brushing her horses.  She has had no improvement with anything.  She is intolerant to most pain medications.  She has had prior forelimb electrodiagnostic [facility] which was normal.  Subsequent electrodiagnostic study in 2020 by Dr. Nita Sickleonika Patel did show ulnar nerve neuropathy on the left.  She has not had any recent physical therapy  or chiropractic care for her neck or shoulder pain.  In 2019 she did have cervical MRI.  This is reviewed with her today with spine models and imaging.  This basically showed multilevel facet arthropathy without foraminal or cervical stenosis or nerve compression.  Review of Systems  Musculoskeletal:  Positive for back pain, joint pain and neck pain.  Neurological:  Positive for tremors.  All other systems reviewed and are negative. Otherwise per HPI.  Assessment & Plan: Visit Diagnoses:    ICD-10-CM   1. Cervicalgia  M54.2 Ambulatory referral to Physical Therapy    2. Cervical spondylosis without myelopathy  M47.812 Ambulatory referral to Physical Therapy    3. Fibromyalgia  M79.7 Ambulatory referral to Physical Therapy    4. Chronic pain of both shoulders  M25.511 Ambulatory referral to Physical Therapy   G89.29    M25.512     5. Paresthesia of skin  R20.2 Ambulatory referral to Physical Therapy       Plan: Findings:  Chronic worsening severe neck pain with bilateral shoulder and arm pain with nondermatomal paresthesias.  I think she has some level of neck pain from facet joint arthritis is seen on the MRI on exam.  Unfortunately she also is clouded by the fact that she has pain from the underlying fibromyalgia and myofascial type pain with both tender points and trigger points.  At this point my recommendation would be for focused physical therapy with dry  needling.  We talked at length about the potential benefit of cervical facet joint blocks but this would not relieve the numbness and tingling that she has persisting.  She has had electrodiagnostic study of the upper limbs.  There was some level of ulnar neuropathy on the left.  For now I think the best approach is to see how much relief she gets of her neck pain with the therapy and dry needling.   Meds & Orders: No orders of the defined types were placed in this encounter.   Orders Placed This Encounter  Procedures   Ambulatory  referral to Physical Therapy    Follow-up: No follow-ups on file.   Procedures: No procedures performed      Clinical History: MRI CERVICAL SPINE WITHOUT CONTRAST   TECHNIQUE: Multiplanar, multisequence MR imaging of the cervical spine was performed. No intravenous contrast was administered.   COMPARISON: Cervical spine MRI 03/30/2017.   FINDINGS: Alignment: Stable cervical lordosis since 2018. Stable subtle anterolisthesis of C7 on T1.   Vertebrae: Degenerative endplate and facet marrow signal changes. No marrow edema or evidence of acute osseous abnormality. T2 benign vertebral body hemangioma.   Cord: Spinal cord signal is within normal limits at all visualized levels. Fairly capacious spinal canal.   Posterior Fossa, vertebral arteries, paraspinal tissues: . preserved major vascular flow voids in the neck, the right vertebral artery is dominant. Negative neck soft tissues.   Disc levels:   C2-C3: Borderline to mild facet hypertrophy with no stenosis.   C3-C4: Small central to right paracentral disc protrusion. Mild to moderate facet hypertrophy greater on the left. No spinal stenosis. Mild to moderate left C4 foraminal stenosis. This level is stable.   C4-C5: Mild disc bulging and endplate spurring. Moderate bilateral facet hypertrophy. No spinal stenosis. Mild to moderate bilateral C5 foraminal stenosis. This level is stable.   C5-C6: Mild disc bulge and endplate spurring. Moderate to severe left and mild to moderate right facet hypertrophy. Mild left ligament flavum hypertrophy. No spinal stenosis. Mild to moderate bilateral C6 foraminal stenosis. This level is stable.   C6-C7: Mild circumferential disc bulge. Mild to moderate facet hypertrophy greater on the left. No spinal stenosis. Mild bilateral C7 foraminal stenosis. This level is stable.   C7-T1: Mild to moderate facet hypertrophy. Borderline to mild C8 foraminal stenosis. This level is stable.    The visible upper thoracic spine appears stable. There is mild chronic anterolisthesis at T2-T3 with associated disc and facet degeneration.   IMPRESSION: 1. Stable MRI appearance of the cervical spine since 2018. 2. Widespread facet more so than cervical disc degeneration, and capacious cervical spinal canal such that there is no spinal stenosis. But there is chronic multilevel mild to moderate bilateral cervical neural foraminal stenosis.     Electronically Signed By: Odessa Fleming M.D. On: 07/22/2018 08:04 ------ 11/16/2018 NCV & EMG Findings: Extensive electrodiagnostic testing of the left upper extremity and additional studies of the left shows:   1. Left ulnar motor response shows conduction block and severely slowed conduction velocity across the elbow (A Elbow-B Elbow, 27 m/s) recording at the abductor digit he minimi muscle.  Additionally, left ulnar motor response recording at the first dorsal interosseous muscle shows reduced amplitude (6.2 mV) and decreased conduction velocity (A Elbow-B Elbow, 40 m/s).  Bilateral median and right ulnar motor responses within normal limits.   2. Chronic motor axonal loss changes are seen affecting the left first dorsal interosseous, abductor digiti minimi, and flexor carpi ulnaris muscles.  There is no evidence of accompanied active denervation.     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     ___________________________ Nita Sickle, DO ------- MRI LUMBAR SPINE WITHOUT CONTRAST   Disc levels:   L4-5: Borderline right subarticular lateral recess stenosis due to facet arthropathy and disc bulge. Small bilateral synovial cysts extend posterior from both facet joints, not causing impingement.   L5-S1: Borderline left subarticular lateral recess stenosis due to facet arthropathy and disc bulge.   IMPRESSION: 1. Lumbar  spondylosis and degenerative disc disease, but with only borderline impingement at the L4-5 and L5-S1 levels. 2. Multilevel facet arthropathy most notable at L4-5, with small synovial cyst extending posterior to the facet joints at this level bilaterally.   By: Gaylyn Rong M.D. On: 04/20/2016 16:05   She reports that she has never smoked. She has never used smokeless tobacco. No results for input(s): HGBA1C, LABURIC in the last 8760 hours.  Objective:  VS:  HT:    WT:   BMI:     BP:106/74  HR:93bpm  TEMP: ( )  RESP:  Physical Exam Vitals and nursing note reviewed.  Constitutional:      General: She is not in acute distress.    Appearance: Normal appearance. She is not ill-appearing.  HENT:     Head: Normocephalic and atraumatic.     Right Ear: External ear normal.     Left Ear: External ear normal.  Eyes:     Extraocular Movements: Extraocular movements intact.  Cardiovascular:     Rate and Rhythm: Normal rate.     Pulses: Normal pulses.  Musculoskeletal:     Cervical back: Tenderness present. No rigidity.     Right lower leg: No edema.     Left lower leg: No edema.     Comments: Patient has good strength in the upper extremities including 5 out of 5 strength in wrist extension long finger flexion and APB.  There is no atrophy of the hands intrinsically.  There is a negative Hoffmann's test.  She has equivocally positive Spurling's bilaterally causing more shoulder pain.  There is some level of shoulder impingement bilaterally.  There is a negative drop arm test bilaterally.  She has tender points along the posterior part of the arm and elbow across the shoulders and trapezius.  She does have actually focal trigger points in the levator scapula and paraspinal musculature and rhomboids.  She does sit with a forward flexed cervical spine.  She does have pain at end ranges of rotation consistent with facet mediated pain.   Lymphadenopathy:     Cervical: No cervical  adenopathy.  Skin:    Findings: No erythema, lesion or rash.  Neurological:     General: No focal deficit present.     Mental Status: She is alert and oriented to person, place, and time.     Sensory: No sensory deficit.     Motor: No weakness or abnormal muscle tone.     Coordination: Coordination normal.  Psychiatric:        Mood and Affect: Mood normal.        Behavior: Behavior normal.    Ortho Exam  Imaging: No results found.  Past Medical/Family/Surgical/Social History: Medications & Allergies reviewed per EMR, new medications updated. 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   Avascular necrosis (HCC) 07/11/2015   Cellulitis and abscess of upper arm and forearm, right 04/28/2013   Chest pain at rest 03/03/2013   HTN (hypertension) 03/03/2013   Past Medical History:  Diagnosis Date   Anxiety    Asthma    Bronchitis    hx of   Depression with anxiety    Environmental allergies    GERD (gastroesophageal reflux disease)    History of nuclear stress test 03/08/2013   lexiscan; normal study; EF 59%   Hx of migraines    Hypertension    Osteoporosis    Seasonal allergies    Family History  Problem Relation Age of Onset   Cancer - Lung Mother    Cancer - Colon Father    Healthy Brother    Cancer - Other Maternal Grandmother 62       brain cancer from melonoma   Cancer - Other Maternal Grandfather 24       stomach cancer   Cancer - Lung Paternal Grandfather    Past Surgical History:  Procedure Laterality Date   ABDOMINAL  HYSTERECTOMY     I & D EXTREMITY Right 04/28/2013   Procedure: IRRIGATION AND DEBRIDEMENT RIGHT WRIST/FOREARM;  Surgeon: Kathryne Hitch, MD;  Location: [facility] OR;  Service: Orthopedics;  Laterality: Right;   INCISION AND DRAINAGE FOREARM / WRIST DEEP Right 04/28/2013   Dr Magnus Ivan   ROTATOR CUFF REPAIR Bilateral    TOTAL HIP ARTHROPLASTY Left    WRIST SURGERY Right    plate and screws in wrist   Social History   Occupational History   Not on file  Tobacco Use   Smoking status: Never   Smokeless tobacco: Never  Substance and Sexual Activity   Alcohol use: Yes    Comment: social   Drug use: No   Sexual activity: Not on file

## 2020-09-20 ENCOUNTER — Ambulatory Visit: Payer: Federal, State, Local not specified - PPO | Admitting: Rehabilitative and Restorative Service Providers"

## 2020-09-20 ENCOUNTER — Encounter: Payer: Self-pay | Admitting: Rehabilitative and Restorative Service Providers"

## 2020-09-20 DIAGNOSIS — G8929 Other chronic pain: Secondary | ICD-10-CM

## 2020-09-20 DIAGNOSIS — M25512 Pain in left shoulder: Secondary | ICD-10-CM | POA: Diagnosis not present

## 2020-09-20 DIAGNOSIS — M25511 Pain in right shoulder: Secondary | ICD-10-CM

## 2020-09-20 DIAGNOSIS — R293 Abnormal posture: Secondary | ICD-10-CM | POA: Diagnosis not present

## 2020-09-20 DIAGNOSIS — M542 Cervicalgia: Secondary | ICD-10-CM

## 2020-09-20 NOTE — Patient Instructions (Signed)
Access Code: [email or web address] URL: [email or web address] Date: 09/20/2020 Prepared by: Chyrel Masson  Exercises Seated Gentle Upper Trapezius Stretch - 2 x daily - 7 x weekly - 1 sets - 5 reps - 15 hold Supine Chin Tuck - 2 x daily - 7 x weekly - 1 sets - 10 reps - 5 hold Shoulder External Rotation and Scapular Retraction with Resistance - 2 x daily - 7 x weekly - 3 sets - 10 reps

## 2020-09-20 NOTE — Therapy (Addendum)
[facility] Physical Therapy [address] [place], [place], [address] Phone: [phone]   Fax:  [phone]  Physical Therapy Evaluation/Discharge  Patient Details  Name: Cynthia Gordon MRN: [id number] Date of Birth: [birth year] Referring Provider (PT): Dr. Ernestina Patches   Encounter Date: 09/20/2020   PT End of Session - 09/20/20 1304    Visit Number 1    Number of Visits 12    Date for PT Re-Evaluation 11/15/20    Progress Note Due on Visit 10    PT Start Time 7858    PT Stop Time 1343    PT Time Calculation (min) 38 min    Activity Tolerance Patient tolerated treatment well    Behavior During Therapy [facility] for tasks assessed/performed           Past Medical History:  Diagnosis Date  . Anxiety   . Asthma   . Bronchitis    hx of  . Depression with anxiety   . Environmental allergies   . GERD (gastroesophageal reflux disease)   . History of nuclear stress test 03/08/2013   lexiscan; normal study; EF 59%  . Hx of migraines   . Hypertension   . Osteoporosis   . Seasonal allergies     Past Surgical History:  Procedure Laterality Date  . ABDOMINAL HYSTERECTOMY    . I & D EXTREMITY Right 04/28/2013   Procedure: IRRIGATION AND DEBRIDEMENT RIGHT WRIST/FOREARM;  Surgeon: Mcarthur Rossetti, MD;  Location: [place];  Service: Orthopedics;  Laterality: Right;  . INCISION AND DRAINAGE FOREARM / WRIST DEEP Right 04/28/2013   Dr Ninfa Linden  . ROTATOR CUFF REPAIR Bilateral   . TOTAL HIP ARTHROPLASTY Left   . WRIST SURGERY Right    plate and screws in wrist    There were no vitals filed for this visit.    Subjective Assessment - 09/20/20 1308    Subjective Pt. indicated chronic complaints of cervical pain c difficulty turning head and movement c tightness and pain noted in both shoulders.   Pt. stated seeing massage 1 every 2 weeks or so to try to help symptoms.    Pertinent History History of bilateral rotator cuff tears per Pt. report.    Limitations  Sitting;House hold activities;Lifting    Diagnostic tests NCV: Left ulnar neuropathy with marked slowing across the elbow, which is predominantly demyelinating and moderate-to-severe in degree electrically.  MRI performed.    Patient Stated Goals Reduce pain, improve shoulder/neck movement    Currently in Pain? Yes    Pain Score 10-Worst pain ever   at worst   Pain Location Neck   bilateral shoulder   Pain Orientation Left;Right    Pain Descriptors / Indicators Tightness;Constant    Pain Type Chronic pain    Pain Onset More than a month ago    Pain Frequency Constant    Aggravating Factors  head turning, constant insidious pain    Pain Relieving Factors massage    Effect of Pain on Daily Activities Limited in driving, household and self care at times.              [facility] PT Assessment - 09/20/20 0001      Assessment   Medical Diagnosis Cervicalgia, spondylosis, fibromyalgia, bilateral shoulder pain    Referring Provider (PT) Dr. Ernestina Patches    Onset Date/Surgical Date 08/08/20   acute on chronic   Hand Dominance Left      Balance Screen   Has the patient fallen in the past 6 months  Yes    How many times? [place] residence    Additional Comments stairs in garage      Prior Function   Level of [employer]    Leisure scrapbook      Cognition   Overall Cognitive Status Within Functional Limits for tasks assessed      Observation/Other Assessments   Focus on Therapeutic Outcomes (FOTO)  40 % intake      Sensation   Light Touch Appears Intact      Posture/Postural Control   Posture/Postural Control Postural limitations    Postural Limitations Rounded Shoulders      ROM / Strength   AROM / PROM / Strength AROM;PROM;Strength      AROM   Overall AROM Comments Shoulder AROM WFL bilateral c pain in superior shoulder/cervical region c activation in elevation, ER/IR    AROM Assessment Site  Cervical;Shoulder    Right/Left Shoulder Left;Right    Cervical Flexion 58    Cervical Extension 36    Cervical - Right Rotation 60   pain Rt cervical   Cervical - Left Rotation 49   pain Lt cervical     PROM   PROM Assessment Site Shoulder    Right/Left Shoulder Right;Left      Strength   Overall Strength Comments Isometric check throughout UE indicated strong control.  Pain in cervical/superior shoulder noted throughout testing.    Strength Assessment Site Shoulder;Elbow    Right/Left Shoulder Right;Left    Right/Left Elbow Right;Left      Palpation   Palpation comment Tenderness noted in bilateral upper trap, levator, infraspinatus, supraspinatus, cervocal paraspoinals c Trp noted                      Objective measurements completed on examination: See above findings.       [facility] Adult PT Treatment/Exercise - 09/20/20 0001      Exercises   Exercises Knee/Hip;Other Exercises    Other Exercises  HEP instruction/performance c cues for techniques of intervention.  Handout provided and HEP consisting of upper trap stretch bilateral 15 sec, scap retraction and Bilateral UE, uc flexion hold supine 5 sec.  Trial performance set of each.      Manual Therapy   Manual therapy comments compression to Rt upper trap            Trigger Point Dry Needling - 09/20/20 0001    Consent Given? Yes    Education Handout Provided Yes    Muscles Treated Head and Neck Upper trapezius   Rt   Upper Trapezius Response Twitch reponse elicited                PT Education - 09/20/20 1304    Education Details HEP, POC, DN    Person(s) Educated Patient    Methods Explanation;Demonstration;Handout;Verbal cues    Comprehension Returned demonstration;Verbalized understanding            PT Short Term Goals - 09/20/20 1412      PT SHORT TERM GOAL #1   Title Patient will demonstrate independent use of home exercise program to maintain progress from in clinic treatments.     Time 3    Period Weeks    Status New    Target Date 10/11/20             PT Long Term Goals - 09/20/20 1412  PT LONG TERM GOAL #1   Title Patient will demonstrate/report pain at worst less than or equal to 2/10 to facilitate minimal limitation in daily activity secondary to pain symptoms.    Time 8    Period Weeks    Status New    Target Date 11/15/20      PT LONG TERM GOAL #2   Title Patient will demonstrate independent use of home exercise program to facilitate ability to maintain/progress functional gains from skilled physical therapy services.    Time 8    Period Weeks    Status New    Target Date 11/15/20      PT LONG TERM GOAL #3   Title Pt. will demonstrate cervical AROM WFL s symptoms to facilitate ability to perform driving, daily activity at PLOF.    Time 8    Period Weeks    Status New    Target Date 11/15/20      PT LONG TERM GOAL #4   Title Pt. will demonstrate bilateral UE MMT 5/5 throughout s symptoms to facilitate ability to perform usual lifting, carrying, household activity at PLOF.    Time 8    Period Weeks    Status New    Target Date 11/15/20      PT LONG TERM GOAL #5   Title Pt. will demonstrate FOTO outcome > or = 48 to indicated reduced disability due to condition.    Time 8    Period Weeks    Status New    Target Date 11/15/20                  Plan - 09/20/20 1403    Clinical Impression Statement Patient is a 60 y.o. female who comes to clinic with complaints of cervical, bilateral shoulder pain with mobility and myofascial trigger point restrictions that impair their ability to perform usual daily and recreational functional activities without increase difficulty/symptoms at this time.  Patient to benefit from skilled PT services to address impairments and limitations to improve to previous level of function without restriction secondary to condition.    Personal Factors and Comorbidities Comorbidity 3+    Comorbidities GERD,  HTN, osteoporosis, depression, fibromyalgia    Examination-Activity Limitations Sit;Sleep;Bend;Carry;Caring for Others;Stand;Dressing;Hygiene/Grooming;Reach Overhead    Examination-Participation Restrictions Cleaning;Community Activity;Driving;Yard Work;Laundry;Meal Prep    Stability/Clinical Decision Making Evolving/Moderate complexity    Clinical Decision Making Moderate    Rehab Potential Fair    PT Frequency --   1-2x/week   PT Duration 8 weeks    PT Treatment/Interventions ADLs/Self Care Home Management;Cryotherapy;Electrical Stimulation;Iontophoresis 56m/ml Dexamethasone;Moist Heat;Traction;Balance training;Therapeutic exercise;Therapeutic activities;Functional mobility training;Stair training;Gait training;DME Instruction;Ultrasound;Neuromuscular re-education;Patient/family education;Spinal Manipulations;Joint Manipulations;Passive range of motion;Dry needling;Taping;Manual techniques    PT Next Visit Plan DN, trigger point release, generalized postural strengthening/mobility    PT Home Exercise Plan [id number]    Consulted and Agree with Plan of Care Patient           Patient will benefit from skilled therapeutic intervention in order to improve the following deficits and impairments:  Hypomobility,Decreased activity tolerance,Decreased strength,Impaired UE functional use,Pain,Increased muscle spasms,Decreased mobility,Decreased range of motion,Impaired perceived functional ability,Improper body mechanics,Postural dysfunction,Impaired flexibility  Visit Diagnosis: Cervicalgia  Chronic left shoulder pain  Chronic right shoulder pain  Abnormal posture     Problem List Patient Active Problem List   Diagnosis Date Noted  . OSA on CPAP 08/20/2020  . Fibromyalgia 08/07/2020  . Chronic bilateral low back pain without sciatica 08/07/2020  . Spondylosis without myelopathy or  radiculopathy, lumbar region 08/07/2020  . Cervical spondylosis without myelopathy 08/07/2020  .  Hypersomnolence 07/01/2019  . Bilateral hearing loss 05/04/2017  . Strain of muscle of right hip 09/14/2015  . Rectocele 08/01/2015  . 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 Avascular necrosis ([place]) 07/11/2015  . Cellulitis and abscess of upper arm and forearm, right 04/28/2013  . Chest pain at rest 03/03/2013  . HTN (hypertension) 03/03/2013   Scot Jun, PT, DPT, OCS, ATC 09/20/20  2:34 PM  PHYSICAL THERAPY DISCHARGE SUMMARY  Visits from Start of Care: 1  Current functional level related to goals / functional outcomes: See note   Remaining deficits: See note   Education / Equipment: HEP Plan: Patient agrees to discharge.  Patient goals were not met. Patient is being discharged due to not returning since the last visit.  ?????    Scot Jun, PT, DPT, OCS, ATC 10/25/20  11:41 AM      [facility] Physical Therapy [address] [place], [place], [address] Phone: [phone]   Fax:  [phone]  Name: Jeneen Doutt MRN: [id number] Date of Birth: [birth year]

## 2020-10-05 ENCOUNTER — Encounter: Payer: Federal, State, Local not specified - PPO | Admitting: Rehabilitative and Restorative Service Providers"

## 2020-10-08 ENCOUNTER — Ambulatory Visit: Payer: Federal, State, Local not specified - PPO | Admitting: Orthopedic Surgery

## 2020-10-08 DIAGNOSIS — K219 Gastro-esophageal reflux disease without esophagitis: Secondary | ICD-10-CM | POA: Diagnosis not present

## 2020-10-08 DIAGNOSIS — R198 Other specified symptoms and signs involving the digestive system and abdomen: Secondary | ICD-10-CM | POA: Diagnosis not present

## 2020-10-08 DIAGNOSIS — Z800 Family history of malignant neoplasm of digestive organs: Secondary | ICD-10-CM | POA: Diagnosis not present

## 2020-10-08 DIAGNOSIS — K3189 Other diseases of stomach and duodenum: Secondary | ICD-10-CM | POA: Diagnosis not present

## 2020-10-10 ENCOUNTER — Encounter: Payer: Federal, State, Local not specified - PPO | Admitting: Rehabilitative and Restorative Service Providers"

## 2020-10-12 ENCOUNTER — Encounter: Payer: Federal, State, Local not specified - PPO | Admitting: Rehabilitative and Restorative Service Providers"

## 2020-10-15 ENCOUNTER — Other Ambulatory Visit: Payer: Self-pay | Admitting: Obstetrics and Gynecology

## 2020-10-15 DIAGNOSIS — Z1231 Encounter for screening mammogram for malignant neoplasm of breast: Secondary | ICD-10-CM

## 2020-10-16 ENCOUNTER — Encounter: Payer: Federal, State, Local not specified - PPO | Admitting: Rehabilitative and Restorative Service Providers"

## 2020-10-18 ENCOUNTER — Encounter: Payer: Federal, State, Local not specified - PPO | Admitting: Rehabilitative and Restorative Service Providers"

## 2020-10-23 ENCOUNTER — Encounter: Payer: Federal, State, Local not specified - PPO | Admitting: Rehabilitative and Restorative Service Providers"

## 2020-10-24 DIAGNOSIS — Z01812 Encounter for preprocedural laboratory examination: Secondary | ICD-10-CM | POA: Diagnosis not present

## 2020-10-25 ENCOUNTER — Encounter: Payer: Federal, State, Local not specified - PPO | Admitting: Rehabilitative and Restorative Service Providers"

## 2020-10-26 DIAGNOSIS — Z1211 Encounter for screening for malignant neoplasm of colon: Secondary | ICD-10-CM | POA: Diagnosis not present

## 2020-10-26 DIAGNOSIS — K219 Gastro-esophageal reflux disease without esophagitis: Secondary | ICD-10-CM | POA: Diagnosis not present

## 2020-10-26 DIAGNOSIS — D123 Benign neoplasm of transverse colon: Secondary | ICD-10-CM | POA: Diagnosis not present

## 2020-10-26 DIAGNOSIS — Z800 Family history of malignant neoplasm of digestive organs: Secondary | ICD-10-CM | POA: Diagnosis not present

## 2020-10-30 ENCOUNTER — Encounter: Payer: Federal, State, Local not specified - PPO | Admitting: Rehabilitative and Restorative Service Providers"

## 2020-11-01 ENCOUNTER — Encounter: Payer: Federal, State, Local not specified - PPO | Admitting: Rehabilitative and Restorative Service Providers"

## 2020-11-05 ENCOUNTER — Encounter: Payer: Federal, State, Local not specified - PPO | Admitting: Rehabilitative and Restorative Service Providers"

## 2020-11-08 ENCOUNTER — Encounter: Payer: Federal, State, Local not specified - PPO | Admitting: Rehabilitative and Restorative Service Providers"

## 2020-11-13 ENCOUNTER — Encounter: Payer: Federal, State, Local not specified - PPO | Admitting: Rehabilitative and Restorative Service Providers"

## 2020-11-15 ENCOUNTER — Encounter: Payer: Federal, State, Local not specified - PPO | Admitting: Rehabilitative and Restorative Service Providers"

## 2020-11-20 ENCOUNTER — Encounter: Payer: Federal, State, Local not specified - PPO | Admitting: Rehabilitative and Restorative Service Providers"

## 2020-11-22 ENCOUNTER — Encounter: Payer: Federal, State, Local not specified - PPO | Admitting: Rehabilitative and Restorative Service Providers"

## 2020-12-03 ENCOUNTER — Inpatient Hospital Stay: Admission: RE | Admit: 2020-12-03 | Payer: Federal, State, Local not specified - PPO | Source: Ambulatory Visit

## 2020-12-04 ENCOUNTER — Other Ambulatory Visit: Payer: Self-pay

## 2020-12-04 ENCOUNTER — Ambulatory Visit
Admission: RE | Admit: 2020-12-04 | Discharge: 2020-12-04 | Disposition: A | Discharge status: Home / Self Care | Payer: Federal, State, Local not specified - PPO | Source: Ambulatory Visit | Attending: Obstetrics and Gynecology | Admitting: Obstetrics and Gynecology

## 2020-12-04 DIAGNOSIS — Z1231 Encounter for screening mammogram for malignant neoplasm of breast: Secondary | ICD-10-CM

## 2021-01-07 DIAGNOSIS — Z01419 Encounter for gynecological examination (general) (routine) without abnormal findings: Secondary | ICD-10-CM | POA: Diagnosis not present

## 2021-02-07 DIAGNOSIS — H2513 Age-related nuclear cataract, bilateral: Secondary | ICD-10-CM | POA: Diagnosis not present

## 2021-02-26 ENCOUNTER — Encounter: Payer: Self-pay | Admitting: Physical Medicine and Rehabilitation

## 2021-03-16 IMAGING — MR MR ANKLE*L* W/O CM
5 series · 36 of 40 positions shown · non-contrast
Comparison: Radiograph 04/02/2020

CLINICAL DATA: Ankle pain and swelling for after falling down
stairs and twisting ankle 3 months ago. No previous relevant
surgery.

EXAM:
MRI OF THE LEFT ANKLE WITHOUT CONTRAST
TECHNIQUE: Multiplanar, multisequence MR imaging of the ankle was performed. No
intravenous contrast was administered.

[Series 4: T2 fat-sat · axial · 3.0mm · 0.50mm/px · z∈[-78,+43]mm · 8 of 32 slices shown (1 of 2)]
[im 1/32]
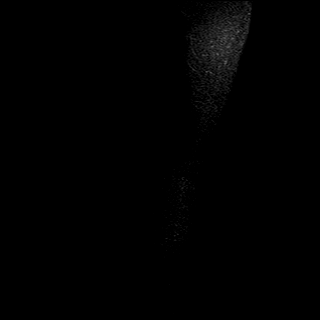
[im 4/32]
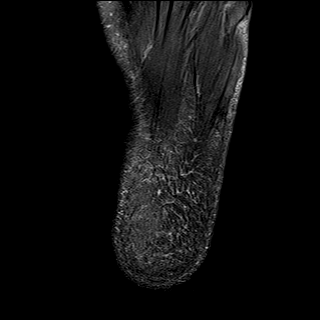
[im 11/32]
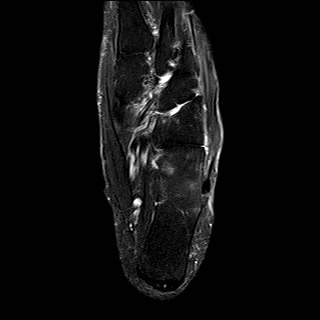
[im 14/32]
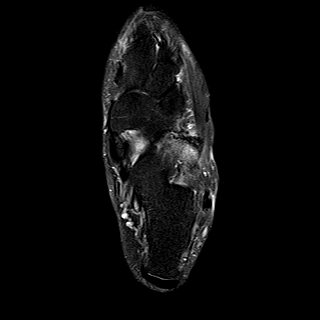
[im 18/32]
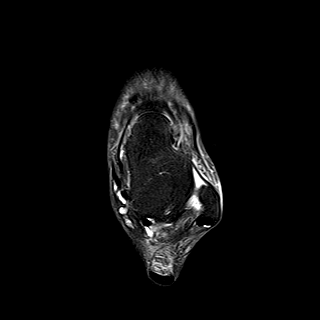
[im 21/32]
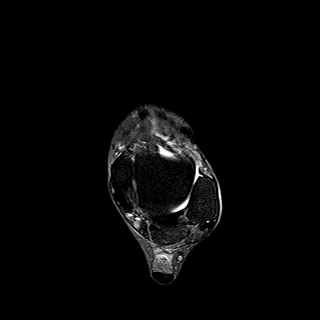
[im 28/32]
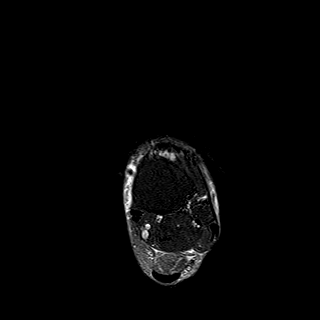
[im 32/32]
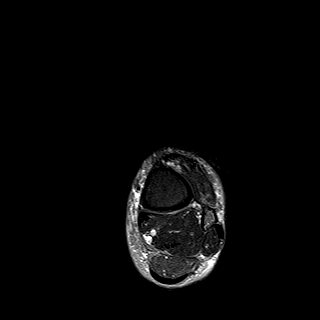

[Series 5: PD fat-sat · axial · 3.0mm · 0.42mm/px · z∈[-78,+43]mm · 9 of 32 slices shown]
[im 1/32]
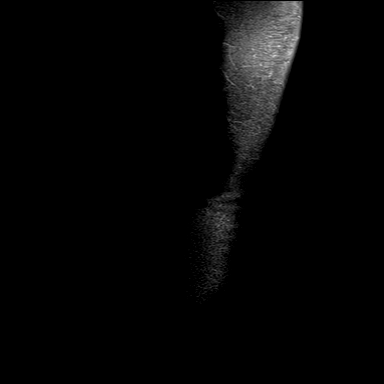
[im 4/32]
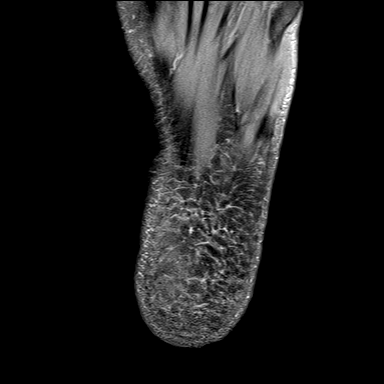
[im 8/32]
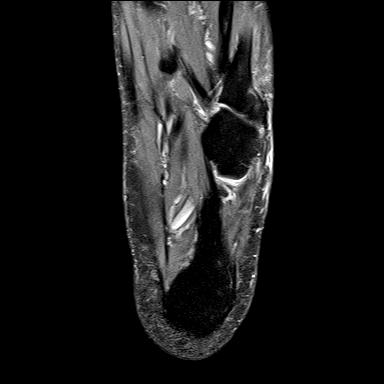
[im 12/32]
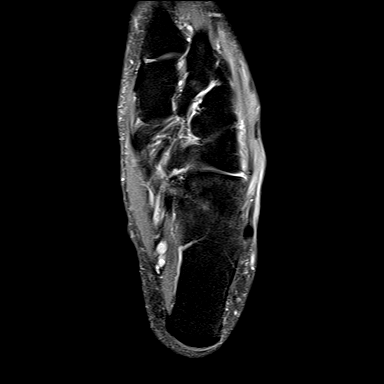
[im 16/32]
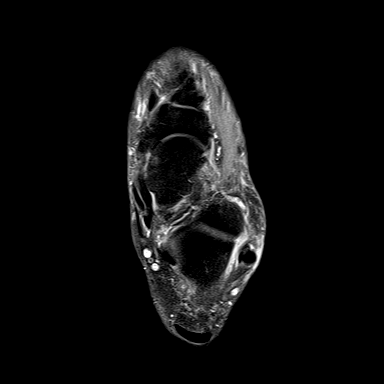
[im 20/32]
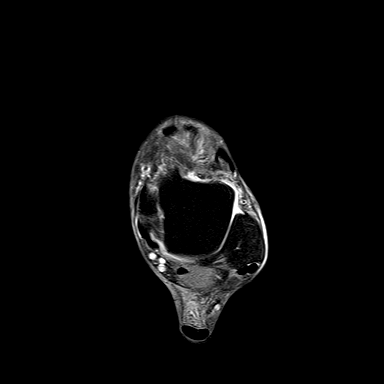
[im 24/32]
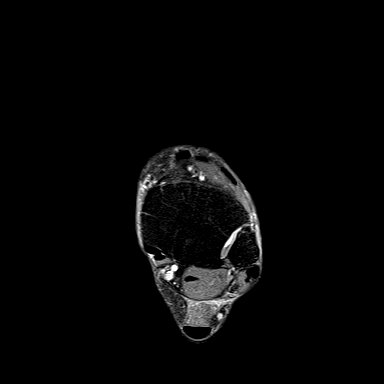
[im 28/32]
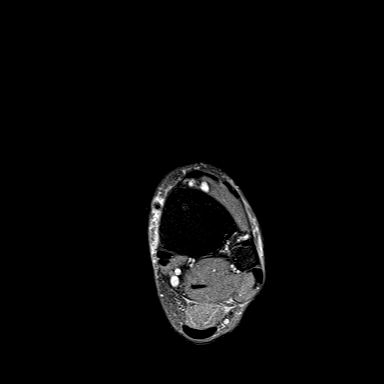
[im 32/32]
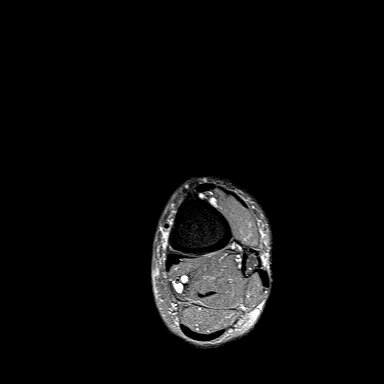

[Series 6: T1 · sagittal · 4.0mm · 0.56mm/px · 6 of 20 slices shown]
[im 1/20]
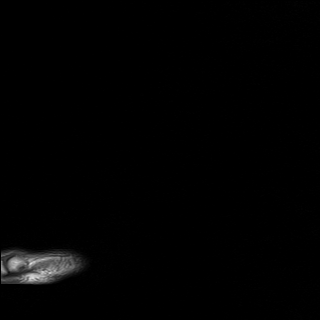
[im 4/20]
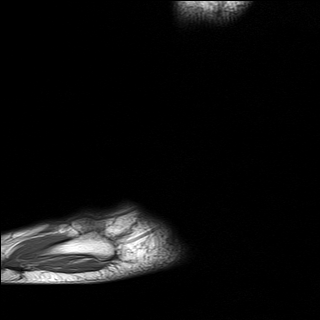
[im 8/20]
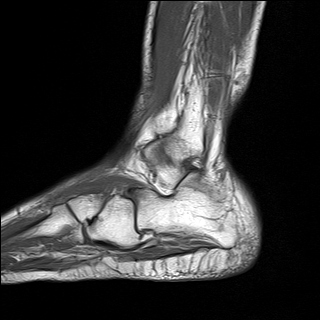
[im 12/20]
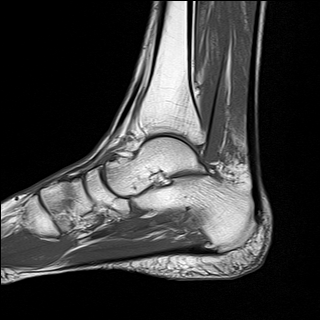
[im 16/20]
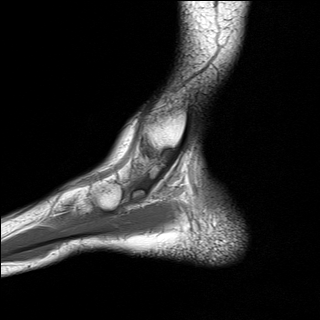
[im 20/20]
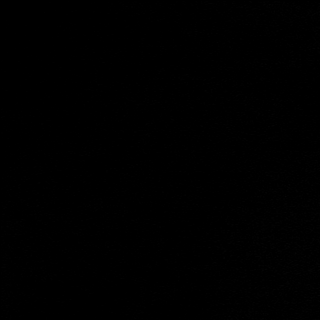

[Series 7: STIR · sagittal · 4.0mm · 0.35mm/px · 4 of 20 slices shown]
[im 1/20]
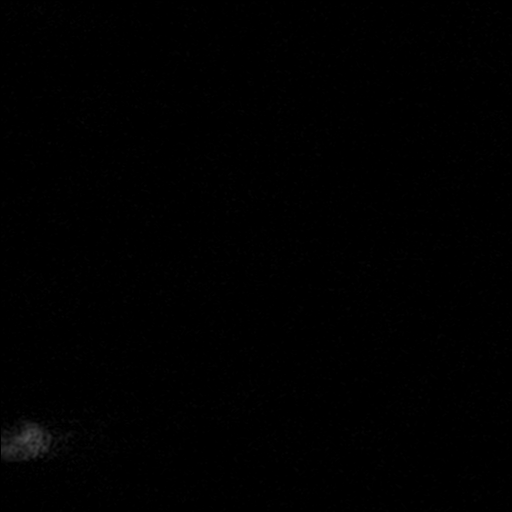
[im 4/20]
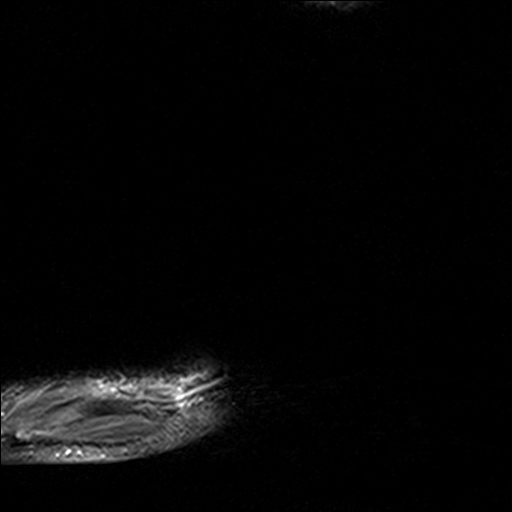
[im 8/20]
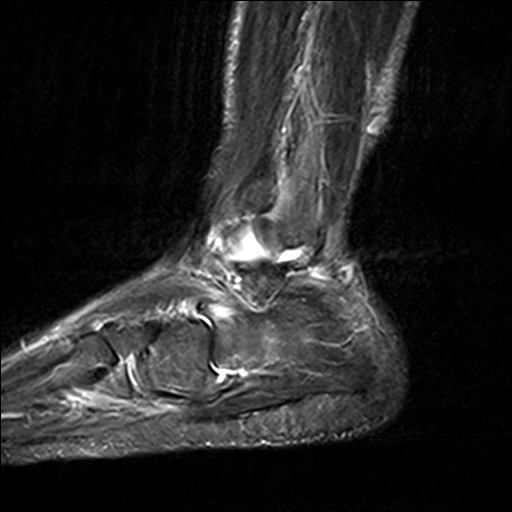
[im 12/20]
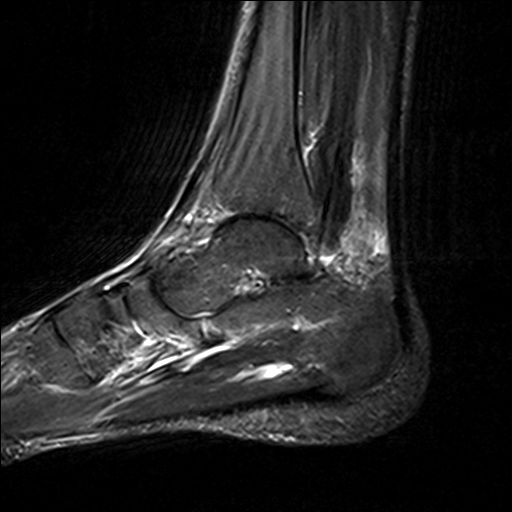

[Series 8: T2 fat-sat · coronal · 3.0mm · 0.50mm/px · 9 of 32 slices shown (2 of 2)]
[im 1/32]
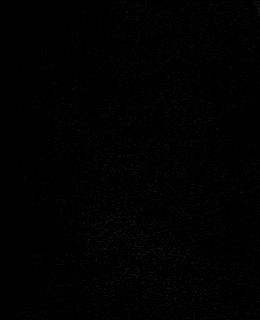
[im 4/32]
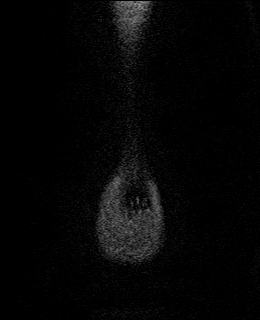
[im 8/32]
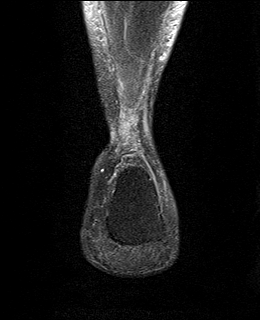
[im 12/32]
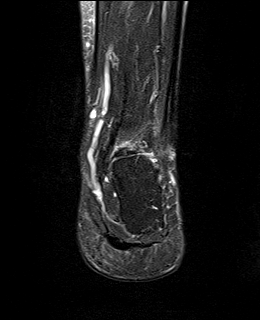
[im 16/32]
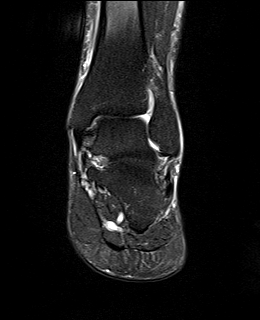
[im 20/32]
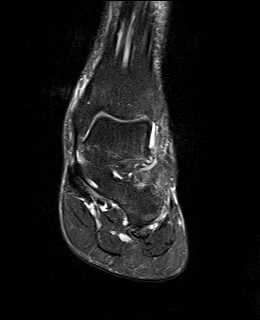
[im 24/32]
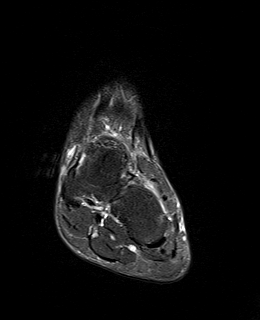
[im 28/32]
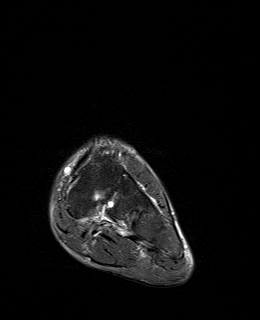
[im 32/32]
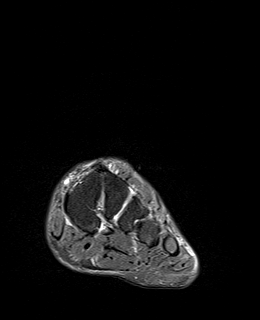

[36 of 40 positions shown; findings below may reference images not displayed]

FINDINGS: TENDONS

Peroneal: Intact and normally positioned.

Posteromedial: Intact and normally positioned. Minimal medial flexor
tendon sheath fluid, within physiologic limits.

Anterior: Intact and normally positioned.

Achilles: Intact.

Plantar Fascia: Intact.

LIGAMENTS

Lateral: The anterior and posterior talofibular and calcaneofibular
ligaments are intact.

Medial: The deltoid and visualized portions of the spring ligament
appear intact.

CARTILAGE AND BONES

Ankle Joint: No significant ankle joint effusion. The talar dome and
tibial plafond are intact.

Subtalar Joints/Sinus Tarsi: Unremarkable.

Bones: There is prominent T2 hyperintensity within the anterior
process the calcaneus which could reflect a subacute fracture. There
is mild surrounding soft tissue edema as well. No significant marrow
edema within the adjacent cuboid. Minimal degenerative spurring in
the adjacent tarsal bones.

Other: Minimal edema in the pre Achilles fat without focal soft
tissue fluid collection.
IMPRESSION: 1. Possible subacute fracture of the anterior process of the
calcaneus versus stress mediated/degenerative edema.
2. No other significant osseous findings. Minimal degenerative
spurring in the adjacent tarsal bones.
3. The ankle tendons and ligaments appear intact.

## 2021-04-29 DIAGNOSIS — K760 Fatty (change of) liver, not elsewhere classified: Secondary | ICD-10-CM | POA: Diagnosis not present

## 2021-04-29 DIAGNOSIS — M810 Age-related osteoporosis without current pathological fracture: Secondary | ICD-10-CM | POA: Diagnosis not present

## 2021-04-29 DIAGNOSIS — Z0000 Encounter for general adult medical examination without abnormal findings: Secondary | ICD-10-CM | POA: Diagnosis not present

## 2021-04-29 DIAGNOSIS — E7800 Pure hypercholesterolemia, unspecified: Secondary | ICD-10-CM | POA: Diagnosis not present

## 2021-04-29 DIAGNOSIS — I10 Essential (primary) hypertension: Secondary | ICD-10-CM | POA: Diagnosis not present

## 2021-08-21 DIAGNOSIS — D225 Melanocytic nevi of trunk: Secondary | ICD-10-CM | POA: Diagnosis not present

## 2021-08-21 DIAGNOSIS — D2272 Melanocytic nevi of left lower limb, including hip: Secondary | ICD-10-CM | POA: Diagnosis not present

## 2021-08-21 DIAGNOSIS — L821 Other seborrheic keratosis: Secondary | ICD-10-CM | POA: Diagnosis not present

## 2021-08-21 DIAGNOSIS — L820 Inflamed seborrheic keratosis: Secondary | ICD-10-CM | POA: Diagnosis not present

## 2021-08-21 DIAGNOSIS — L700 Acne vulgaris: Secondary | ICD-10-CM | POA: Diagnosis not present

## 2021-09-11 DIAGNOSIS — Z471 Aftercare following joint replacement surgery: Secondary | ICD-10-CM | POA: Diagnosis not present

## 2021-09-11 DIAGNOSIS — M1612 Unilateral primary osteoarthritis, left hip: Secondary | ICD-10-CM | POA: Diagnosis not present

## 2021-09-11 DIAGNOSIS — M8700 Idiopathic aseptic necrosis of unspecified bone: Secondary | ICD-10-CM | POA: Diagnosis not present

## 2021-09-11 DIAGNOSIS — Z96642 Presence of left artificial hip joint: Secondary | ICD-10-CM | POA: Diagnosis not present

## 2021-10-22 DIAGNOSIS — L282 Other prurigo: Secondary | ICD-10-CM | POA: Diagnosis not present

## 2021-10-22 DIAGNOSIS — L245 Irritant contact dermatitis due to other chemical products: Secondary | ICD-10-CM | POA: Diagnosis not present

## 2021-11-19 ENCOUNTER — Other Ambulatory Visit: Payer: Self-pay | Admitting: Internal Medicine

## 2021-11-19 DIAGNOSIS — E7800 Pure hypercholesterolemia, unspecified: Secondary | ICD-10-CM

## 2021-12-23 ENCOUNTER — Other Ambulatory Visit: Payer: Federal, State, Local not specified - PPO

## 2022-01-03 ENCOUNTER — Other Ambulatory Visit: Payer: Federal, State, Local not specified - PPO

## 2022-02-27 DIAGNOSIS — Z1272 Encounter for screening for malignant neoplasm of vagina: Secondary | ICD-10-CM | POA: Diagnosis not present

## 2022-02-27 DIAGNOSIS — N952 Postmenopausal atrophic vaginitis: Secondary | ICD-10-CM | POA: Diagnosis not present

## 2022-02-27 DIAGNOSIS — Z01419 Encounter for gynecological examination (general) (routine) without abnormal findings: Secondary | ICD-10-CM | POA: Diagnosis not present

## 2022-02-27 DIAGNOSIS — M810 Age-related osteoporosis without current pathological fracture: Secondary | ICD-10-CM | POA: Diagnosis not present

## 2022-02-27 DIAGNOSIS — F419 Anxiety disorder, unspecified: Secondary | ICD-10-CM | POA: Diagnosis not present

## 2022-03-06 ENCOUNTER — Other Ambulatory Visit: Payer: Federal, State, Local not specified - PPO

## 2022-04-30 DIAGNOSIS — Z0000 Encounter for general adult medical examination without abnormal findings: Secondary | ICD-10-CM | POA: Diagnosis not present

## 2022-05-05 ENCOUNTER — Ambulatory Visit
Admission: RE | Admit: 2022-05-05 | Discharge: 2022-05-05 | Disposition: A | Discharge status: Home / Self Care | Payer: Federal, State, Local not specified - PPO | Source: Ambulatory Visit | Attending: Internal Medicine | Admitting: Internal Medicine

## 2022-05-05 DIAGNOSIS — E7800 Pure hypercholesterolemia, unspecified: Secondary | ICD-10-CM

## 2022-05-05 DIAGNOSIS — R918 Other nonspecific abnormal finding of lung field: Secondary | ICD-10-CM | POA: Diagnosis not present

## 2022-05-08 DIAGNOSIS — M810 Age-related osteoporosis without current pathological fracture: Secondary | ICD-10-CM | POA: Diagnosis not present

## 2022-05-08 DIAGNOSIS — Z0000 Encounter for general adult medical examination without abnormal findings: Secondary | ICD-10-CM | POA: Diagnosis not present

## 2022-05-08 DIAGNOSIS — E7800 Pure hypercholesterolemia, unspecified: Secondary | ICD-10-CM | POA: Diagnosis not present

## 2022-06-09 DIAGNOSIS — L821 Other seborrheic keratosis: Secondary | ICD-10-CM | POA: Diagnosis not present

## 2022-08-12 ENCOUNTER — Telehealth: Payer: Self-pay

## 2022-08-12 NOTE — Patient Outreach (Signed)
  Care Coordination   08/12/2022 Name: Cynthia Gordon MRN: [id number] DOB: 03/07/1961   Care Coordination Outreach Attempts:  An unsuccessful telephone outreach was attempted today to offer the patient information about available care coordination services as a benefit of their health plan.   Follow Up Plan:  Additional outreach attempts will be made to offer the patient care coordination information and services.   Encounter Outcome:  No Answer   Care Coordination Interventions:  No, not indicated    Bary Leriche, RN, MSN [facility] Care Management Care Management Coordinator Direct Line [phone]

## 2022-08-13 DIAGNOSIS — L738 Other specified follicular disorders: Secondary | ICD-10-CM | POA: Diagnosis not present

## 2022-08-13 DIAGNOSIS — L821 Other seborrheic keratosis: Secondary | ICD-10-CM | POA: Diagnosis not present

## 2022-08-13 DIAGNOSIS — L700 Acne vulgaris: Secondary | ICD-10-CM | POA: Diagnosis not present

## 2022-08-13 DIAGNOSIS — D2262 Melanocytic nevi of left upper limb, including shoulder: Secondary | ICD-10-CM | POA: Diagnosis not present

## 2022-09-03 ENCOUNTER — Telehealth: Payer: Self-pay

## 2022-09-03 NOTE — Patient Outreach (Signed)
  Care Coordination   09/03/2022 Name: Cynthia Gordon MRN: [id number] DOB: 04/19/1961   Care Coordination Outreach Attempts:  A second unsuccessful outreach was attempted today to offer the patient with information about available care coordination services as a benefit of their health plan.     Follow Up Plan:  Additional outreach attempts will be made to offer the patient care coordination information and services.   Encounter Outcome:  No Answer   Care Coordination Interventions:  No, not indicated    Bary Leriche, RN, MSN [facility] Care Management Care Management Coordinator Direct Line [phone]

## 2022-09-17 ENCOUNTER — Telehealth: Payer: Self-pay

## 2022-09-17 NOTE — Patient Outreach (Signed)
  Care Coordination   09/17/2022 Name: Cynthia Gordon MRN: [id number] DOB: 06-03-1961   Care Coordination Outreach Attempts:  A third unsuccessful outreach was attempted today to offer the patient with information about available care coordination services as a benefit of their health plan.   Follow Up Plan:  No further outreach attempts will be made at this time. We have been unable to contact the patient to offer or enroll patient in care coordination services  Encounter Outcome:  No Answer   Care Coordination Interventions:  No, not indicated    Jone Baseman, RN, MSN [facility] Care Management Coordinator Direct Line [phone]

## 2022-10-03 DIAGNOSIS — L309 Dermatitis, unspecified: Secondary | ICD-10-CM | POA: Diagnosis not present

## 2022-10-03 DIAGNOSIS — L282 Other prurigo: Secondary | ICD-10-CM | POA: Diagnosis not present

## 2022-10-31 DIAGNOSIS — L300 Nummular dermatitis: Secondary | ICD-10-CM | POA: Diagnosis not present

## 2022-10-31 DIAGNOSIS — L282 Other prurigo: Secondary | ICD-10-CM | POA: Diagnosis not present

## 2022-11-13 DIAGNOSIS — K219 Gastro-esophageal reflux disease without esophagitis: Secondary | ICD-10-CM | POA: Diagnosis not present

## 2022-11-13 DIAGNOSIS — I10 Essential (primary) hypertension: Secondary | ICD-10-CM | POA: Diagnosis not present

## 2022-11-13 DIAGNOSIS — E559 Vitamin D deficiency, unspecified: Secondary | ICD-10-CM | POA: Diagnosis not present

## 2022-11-13 DIAGNOSIS — E7800 Pure hypercholesterolemia, unspecified: Secondary | ICD-10-CM | POA: Diagnosis not present

## 2023-01-21 DIAGNOSIS — Z13820 Encounter for screening for osteoporosis: Secondary | ICD-10-CM | POA: Diagnosis not present

## 2023-02-18 DIAGNOSIS — M79642 Pain in left hand: Secondary | ICD-10-CM | POA: Diagnosis not present

## 2023-02-18 DIAGNOSIS — M79641 Pain in right hand: Secondary | ICD-10-CM | POA: Diagnosis not present

## 2023-02-20 DIAGNOSIS — M25572 Pain in left ankle and joints of left foot: Secondary | ICD-10-CM | POA: Diagnosis not present

## 2023-03-05 DIAGNOSIS — Z01419 Encounter for gynecological examination (general) (routine) without abnormal findings: Secondary | ICD-10-CM | POA: Diagnosis not present

## 2023-04-01 DIAGNOSIS — Z96642 Presence of left artificial hip joint: Secondary | ICD-10-CM | POA: Diagnosis not present

## 2023-04-01 DIAGNOSIS — Z471 Aftercare following joint replacement surgery: Secondary | ICD-10-CM | POA: Diagnosis not present

## 2023-04-01 DIAGNOSIS — M5030 Other cervical disc degeneration, unspecified cervical region: Secondary | ICD-10-CM | POA: Diagnosis not present

## 2023-05-19 DIAGNOSIS — I10 Essential (primary) hypertension: Secondary | ICD-10-CM | POA: Diagnosis not present

## 2023-05-19 DIAGNOSIS — E7800 Pure hypercholesterolemia, unspecified: Secondary | ICD-10-CM | POA: Diagnosis not present

## 2023-05-19 DIAGNOSIS — E559 Vitamin D deficiency, unspecified: Secondary | ICD-10-CM | POA: Diagnosis not present

## 2023-05-19 DIAGNOSIS — R350 Frequency of micturition: Secondary | ICD-10-CM | POA: Diagnosis not present

## 2023-05-26 DIAGNOSIS — I10 Essential (primary) hypertension: Secondary | ICD-10-CM | POA: Diagnosis not present

## 2023-05-26 DIAGNOSIS — E7800 Pure hypercholesterolemia, unspecified: Secondary | ICD-10-CM | POA: Diagnosis not present

## 2023-05-26 DIAGNOSIS — Z0000 Encounter for general adult medical examination without abnormal findings: Secondary | ICD-10-CM | POA: Diagnosis not present

## 2023-05-26 DIAGNOSIS — E673 Hypervitaminosis D: Secondary | ICD-10-CM | POA: Diagnosis not present

## 2023-07-03 DIAGNOSIS — M4312 Spondylolisthesis, cervical region: Secondary | ICD-10-CM | POA: Diagnosis not present

## 2023-07-03 DIAGNOSIS — M8580 Other specified disorders of bone density and structure, unspecified site: Secondary | ICD-10-CM | POA: Diagnosis not present

## 2023-07-03 DIAGNOSIS — M47812 Spondylosis without myelopathy or radiculopathy, cervical region: Secondary | ICD-10-CM | POA: Diagnosis not present

## 2023-07-03 DIAGNOSIS — M549 Dorsalgia, unspecified: Secondary | ICD-10-CM | POA: Diagnosis not present

## 2023-07-03 DIAGNOSIS — M542 Cervicalgia: Secondary | ICD-10-CM | POA: Diagnosis not present

## 2023-11-16 DIAGNOSIS — D1801 Hemangioma of skin and subcutaneous tissue: Secondary | ICD-10-CM | POA: Diagnosis not present

## 2023-11-16 DIAGNOSIS — L570 Actinic keratosis: Secondary | ICD-10-CM | POA: Diagnosis not present

## 2023-11-16 DIAGNOSIS — L700 Acne vulgaris: Secondary | ICD-10-CM | POA: Diagnosis not present

## 2023-11-16 DIAGNOSIS — L813 Cafe au lait spots: Secondary | ICD-10-CM | POA: Diagnosis not present

## 2023-11-16 DIAGNOSIS — L821 Other seborrheic keratosis: Secondary | ICD-10-CM | POA: Diagnosis not present

## 2023-11-23 DIAGNOSIS — I10 Essential (primary) hypertension: Secondary | ICD-10-CM | POA: Diagnosis not present

## 2023-11-23 DIAGNOSIS — G4700 Insomnia, unspecified: Secondary | ICD-10-CM | POA: Diagnosis not present

## 2023-11-23 DIAGNOSIS — E559 Vitamin D deficiency, unspecified: Secondary | ICD-10-CM | POA: Diagnosis not present

## 2023-11-23 DIAGNOSIS — E7800 Pure hypercholesterolemia, unspecified: Secondary | ICD-10-CM | POA: Diagnosis not present

## 2023-11-25 ENCOUNTER — Encounter: Payer: Self-pay | Admitting: Podiatry

## 2023-11-25 ENCOUNTER — Ambulatory Visit: Admitting: Podiatry

## 2023-11-25 ENCOUNTER — Ambulatory Visit (INDEPENDENT_AMBULATORY_CARE_PROVIDER_SITE_OTHER)

## 2023-11-25 DIAGNOSIS — G8929 Other chronic pain: Secondary | ICD-10-CM | POA: Diagnosis not present

## 2023-11-25 DIAGNOSIS — M79671 Pain in right foot: Secondary | ICD-10-CM

## 2023-11-25 DIAGNOSIS — M7671 Peroneal tendinitis, right leg: Secondary | ICD-10-CM

## 2023-11-25 MED ORDER — TRIAMCINOLONE ACETONIDE 10 MG/ML IJ SUSP
10.0000 mg | Freq: Once | INTRAMUSCULAR | Status: AC
  Administered 2023-11-25: 10 mg via INTRA_ARTICULAR

## 2023-11-25 MED ORDER — DICLOFENAC SODIUM 75 MG PO TBEC: 75.0000 mg | DELAYED_RELEASE_TABLET | Freq: Two times a day (BID) | ORAL | 2 refills | Status: AC

## 2023-11-26 NOTE — Progress Notes (Signed)
 Subjective:   Patient ID: Cynthia Gordon, female   DOB: 63 y.o.   MRN: [id number]   HPI Patient presents with a lot of pain in the outside of the right foot and states that it has been going on for a relatively short period of time.  Has had a history of discomfort in the lesser joints and that is periodic but not too bothersome and patient is very active does not smoke   Review of Systems  All other systems reviewed and are negative.       Objective:  Physical Exam Vitals and nursing note reviewed.  Constitutional:      Appearance: She is well-developed.  Pulmonary:     Effort: Pulmonary effort is normal.  Musculoskeletal:        General: Normal range of motion.  Skin:    General: Skin is warm.  Neurological:     Mental Status: She is alert.     Neurovascular status found to be intact muscle strength found to be adequate range of motion within normal limits with inflammation fluid around the base of the fifth metatarsal and proximal to this with mild fusiform swelling of the peroneal tendon.  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  May require MRI or other treatment if symptoms persist  X-rays were negative for signs of bony changes or pathology around the base of fifth metatarsal with patient found to have mild reduced motion of the second MPJ bilateral

## 2023-12-15 DIAGNOSIS — H25043 Posterior subcapsular polar age-related cataract, bilateral: Secondary | ICD-10-CM | POA: Diagnosis not present

## 2023-12-15 DIAGNOSIS — H18413 Arcus senilis, bilateral: Secondary | ICD-10-CM | POA: Diagnosis not present

## 2023-12-15 DIAGNOSIS — H2511 Age-related nuclear cataract, right eye: Secondary | ICD-10-CM | POA: Diagnosis not present

## 2023-12-15 DIAGNOSIS — H25013 Cortical age-related cataract, bilateral: Secondary | ICD-10-CM | POA: Diagnosis not present

## 2023-12-15 DIAGNOSIS — H2513 Age-related nuclear cataract, bilateral: Secondary | ICD-10-CM | POA: Diagnosis not present

## 2024-01-27 ENCOUNTER — Ambulatory Visit: Admitting: Podiatry

## 2024-01-27 ENCOUNTER — Ambulatory Visit (INDEPENDENT_AMBULATORY_CARE_PROVIDER_SITE_OTHER)

## 2024-01-27 ENCOUNTER — Encounter: Payer: Self-pay | Admitting: Podiatry

## 2024-01-27 VITALS — Ht 63.0 in | Wt 161.0 lb

## 2024-01-27 DIAGNOSIS — M7671 Peroneal tendinitis, right leg: Secondary | ICD-10-CM

## 2024-01-27 DIAGNOSIS — M722 Plantar fascial fibromatosis: Secondary | ICD-10-CM | POA: Diagnosis not present

## 2024-01-27 MED ORDER — TRIAMCINOLONE ACETONIDE 10 MG/ML IJ SUSP
10.0000 mg | Freq: Once | INTRAMUSCULAR | Status: AC
  Administered 2024-01-27: 10 mg via INTRA_ARTICULAR

## 2024-01-27 MED ORDER — MELOXICAM 15 MG PO TABS: 15.0000 mg | ORAL_TABLET | Freq: Every day | ORAL | 2 refills | Status: AC

## 2024-01-28 NOTE — Progress Notes (Signed)
 Subjective:   Patient ID: Cynthia Gordon, female   DOB: 63 y.o.   MRN: [id number]   HPI Patient states she is developing a lot of pain in her right foot and states she has been very active.  States that the area that we worked on is some better but the pain is more on the top of the foot and into the right arch and she is having trouble weightbearing and needs to weight-bear due to her movement neuro   ROS      Objective:  Physical Exam  Vascular status intact with perineal inflammation right that is improved with a lot of pain in the forefoot around the 4th and 5th metatarsal shafts and also into the arch with fluid buildup of the mid arch area right     Assessment:  Continues to experience acute inflammation with some of this possible compensatory Orasone  problem     Plan:  H&P reviewed x-ray taken today due to intensity of discomfort and went ahead to careful injection dorsal tendon complex 3 mg Dexasone Kenalog  5 mg Xylocaine  into the right arch 3 mg Kenalog  5 mg Xylocaine  and then carefully applied air fracture walker to completely immobilize the lower leg.  Patient will be seen back for us  to recheck in 3 weeks may require MRI  X-rays were negative for any acute fracture or other pathology associated with this new pain and also placed on Mobic 

## 2024-02-13 ENCOUNTER — Other Ambulatory Visit: Payer: Self-pay

## 2024-02-13 ENCOUNTER — Emergency Department (HOSPITAL_COMMUNITY)
Admission: EM | Admit: 2024-02-13 | Discharge: 2024-02-13 | Disposition: A | Discharge status: Home / Self Care | Attending: Emergency Medicine | Admitting: Emergency Medicine

## 2024-02-13 ENCOUNTER — Emergency Department (HOSPITAL_COMMUNITY)

## 2024-02-13 DIAGNOSIS — W0110XA Fall on same level from slipping, tripping and stumbling with subsequent striking against unspecified object, initial encounter: Secondary | ICD-10-CM | POA: Insufficient documentation

## 2024-02-13 DIAGNOSIS — S4991XA Unspecified injury of right shoulder and upper arm, initial encounter: Secondary | ICD-10-CM | POA: Diagnosis not present

## 2024-02-13 DIAGNOSIS — S42031A Displaced fracture of lateral end of right clavicle, initial encounter for closed fracture: Secondary | ICD-10-CM | POA: Insufficient documentation

## 2024-02-13 DIAGNOSIS — W19XXXA Unspecified fall, initial encounter: Secondary | ICD-10-CM | POA: Diagnosis not present

## 2024-02-13 DIAGNOSIS — S4990XA Unspecified injury of shoulder and upper arm, unspecified arm, initial encounter: Secondary | ICD-10-CM | POA: Diagnosis not present

## 2024-02-13 DIAGNOSIS — S80812A Abrasion, left lower leg, initial encounter: Secondary | ICD-10-CM | POA: Insufficient documentation

## 2024-02-13 DIAGNOSIS — R0902 Hypoxemia: Secondary | ICD-10-CM | POA: Diagnosis not present

## 2024-02-13 MED ORDER — OXYCODONE-ACETAMINOPHEN 5-325 MG PO TABS: 1.0000 | ORAL_TABLET | Freq: Four times a day (QID) | ORAL | 0 refills | Status: AC | PRN

## 2024-02-13 MED ORDER — DIPHENHYDRAMINE HCL 25 MG PO CAPS
25.0000 mg | ORAL_CAPSULE | Freq: Once | ORAL | Status: AC
  Administered 2024-02-13: 25 mg via ORAL
  Filled 2024-02-13: qty 1

## 2024-02-13 MED ORDER — OXYCODONE HCL 5 MG PO TABS
5.0000 mg | ORAL_TABLET | Freq: Once | ORAL | Status: AC
  Administered 2024-02-13: 5 mg via ORAL
  Filled 2024-02-13: qty 1

## 2024-02-13 NOTE — Progress Notes (Signed)
 Orthopedic Tech Progress Note Patient Details:  Mahima Hottle [facility] Dec 14, 1960 [id number]  Ortho Devices Type of Ortho Device: Shoulder immobilizer Ortho Device/Splint Location: RUE Ortho Device/Splint Interventions: Ordered, Application, Adjustment   Post Interventions Patient Tolerated: Well Instructions Provided: Care of device, Adjustment of device  Herbie Loll 02/13/2024, 3:46 PM

## 2024-02-13 NOTE — ED Notes (Signed)
Pt very nauseated.

## 2024-02-13 NOTE — ED Provider Notes (Signed)
 [facility] AT [facility] Provider Note   CSN: [id number] Arrival date & time: 02/13/24  1323     History  Chief Complaint  Patient presents with   Shoulder Injury    Pt presents today from home s/p tripping over step stool. No headstrike, no LOC, not on thinners. Pt has obvious R shoulder deformity and pain. Also has small lac on L shin bleeding controlled. Pt has +2 radial pulse on affected extremity.     Cynthia Gordon is a 63 y.o. female who presents to the emergency department with a chief complaint of tripping over a stool and landing directly on her right shoulder.  Patient denies hitting her head, no loss of consciousness, not on blood thinners.  Patient denies dizziness, syncope, weakness since fall.  Patient ambulatory after.  Complaining of significant right shoulder pain.   Shoulder Injury Pertinent negatives include no headaches.       Home Medications Prior to Admission medications   Medication Sig Start Date End Date Taking? Authorizing Provider  oxyCODONE -acetaminophen  (PERCOCET/ROXICET) 5-325 MG tablet Take 1 tablet by mouth every 6 (six) hours as needed for severe pain (pain score 7-10). 02/13/24  Yes Lind Repine, MD  ALPRAZolam (XANAX XR) 1 MG 24 hr tablet Take 1 mg by mouth daily.    [provider]  atorvastatin (LIPITOR) 20 MG tablet Take 20 mg by mouth daily.    [provider]  buPROPion  (WELLBUTRIN  XL) 300 MG 24 hr tablet Take 300 mg by mouth daily.    [provider]  cyclobenzaprine (FLEXERIL) 10 MG tablet Take 10 mg by mouth 2 (two) times daily as needed for muscle spasms.    [provider]  dexlansoprazole (DEXILANT) 60 MG capsule Take 60 mg by mouth daily.    [provider]  diclofenac  (VOLTAREN ) 75 MG EC tablet Take 1 tablet (75 mg total) by mouth 2 (two) times daily. 11/25/23   Brandt Cake, DPM  DULoxetine  (CYMBALTA ) 60 MG capsule Take 60 mg by mouth daily.  07/14/15   [provider]  meloxicam  (MOBIC ) 15 MG tablet Take 1 tablet (15 mg total) by mouth daily. 01/27/24   Brandt Cake, DPM  topiramate  (TOPAMAX ) 100 MG tablet Take 100 mg by mouth daily.     [provider]  valsartan -hydrochlorothiazide  (DIOVAN -HCT) 320-25 MG per tablet Take 1 tablet by mouth daily.    [provider]  zolpidem  (AMBIEN ) 5 MG tablet Take 1 tablet by mouth at bedtime.    [provider]      Allergies    Amoxicillin, Codeine, Demerol [meperidine], Morphine  and codeine, Penicillin g, Septra [sulfamethoxazole-trimethoprim], and Tetracyclines & related    Review of Systems   Review of Systems  Musculoskeletal:        R shoulder pain, swelling of R shoulder and clavicle   Skin:  Positive for wound (small laceration to left leg).  Neurological:  Negative for dizziness, weakness and headaches.  All other systems reviewed and are negative.   Physical Exam Updated Vital Signs BP 111/77 (BP Location: Left Arm)   Pulse 91   Temp 98.2 F (36.8 C) (Oral)   Resp 17   Ht 5\' 3"  (1.6 m)   Wt 72.6 kg   SpO2 100%   BMI 28.34 kg/m  Physical Exam Vitals and nursing note reviewed.  Constitutional:      General: She is awake. She is not in acute distress.    Appearance: Normal  appearance. She is not ill-appearing or toxic-appearing.  HENT:     Head: Normocephalic and atraumatic.  Eyes:     General: No scleral icterus.    Extraocular Movements: Extraocular movements intact.  Pulmonary:     Effort: Pulmonary effort is normal.  Musculoskeletal:     Cervical back: Normal range of motion.     Right lower leg: No edema.     Left lower leg: No edema.     Comments: Significant swelling and tenderness over right clavicle, swelling of her right shoulder as well, mild tenderness to palpation.  Range of motion of right shoulder significantly reduced.  Right upper extremity neurovascularly intact.  Range of motion of neck intact.  Patient  denies head pain, scalp pain, face pain, or neck pain.    Skin:    General: Skin is warm and dry.     Capillary Refill: Capillary refill takes less than 2 seconds.     Comments: Small laceration/abrasion present on lower left leg  Neurological:     General: No focal deficit present.     Mental Status: She is alert and oriented to person, place, and time.  Psychiatric:        Mood and Affect: Mood normal.        Behavior: Behavior normal. Behavior is cooperative.        Thought Content: Thought content normal.     ED Results / Procedures / Treatments   Labs (all labs ordered are listed, but only abnormal results are displayed) Labs Reviewed - No data to display  EKG None  Radiology DG Shoulder Right Result Date: 02/13/2024 CLINICAL DATA:  injury.  Fall. EXAM: RIGHT SHOULDER - 2+ VIEW COMPARISON:  None Available. FINDINGS: There is mildly displaced, comminuted and angulated fracture of the lateral third of the right clavicle. No other acute fracture or dislocation. No aggressive osseous lesion. Glenohumeral and acromioclavicular joints are normal in alignment and exhibit no significant degenerative changes. No soft tissue swelling. No radiopaque foreign bodies. IMPRESSION: *Mildly displaced, comminuted and angulated fracture of the lateral third of the right clavicle. Electronically Signed   By: Beula Brunswick M.D.   On: 02/13/2024 14:43    Procedures Procedures    Medications Ordered in ED Medications  oxyCODONE  (Oxy IR/ROXICODONE ) immediate release tablet 5 mg (5 mg Oral Given 02/13/24 1551)  diphenhydrAMINE  (BENADRYL ) capsule 25 mg (25 mg Oral Given 02/13/24 1551)    ED Course/ Medical Decision Making/ A&P    Patient presents to the ED for concern of fall, right shoulder pain, this involves an extensive number of treatment options, and is a complaint that carries with it a high risk of complications and morbidity.  The differential diagnosis includes brain bleed, cervical spine  injury, fracture, soft tissue injury, etc.   Co morbidities that complicate the patient evaluation  none   Imaging Studies ordered:  I ordered imaging studies including x-ray right shoulder I independently visualized and interpreted imaging which showed: IMPRESSION:  *Mildly displaced, comminuted and angulated fracture of the lateral  third of the right clavicle.   I agree with the radiologist interpretation   Medicines ordered and prescription drug management:  I ordered medication including oxycodone  for pain, benadryl  for patient reported itching when taking oxycodone  previously Reevaluation of the patient after these medicines showed that the patient improved I have reviewed the patients home medicines and have made adjustments as needed   Test Considered:  none   Critical Interventions:  none   Problem  List / ED Course:  Fall, right shoulder pain Patient states that she did not hit her head, no head tenderness, no cervical spine tenderness, patient ambulatory after fall, no blood thinners, no loss of consciousness, no headache X-ray of right shoulder significant for lateral clavicle fracture, mildly displaced, comminuted, angulated Range of motion of elbow, wrist of right arm intact, patient neurovascularly intact distal to fracture. Patient placed in sling and given outpatient pain medication as well as orthopedic follow-up Return precautions given Patient discharged   Reevaluation:  After the interventions noted above, I reevaluated the patient and found that they have :improved   Social Determinants of Health:  none   Dispostion:  After consideration of the diagnostic results and the patients response to treatment, I feel that the patent would benefit from discharge with sling and outpatient pain medication, orthopedic follow-up.  Return precautions given. Click here for ABCD2, HEART and other calculatorsREFRESH Note before signing :1}                               Medical Decision Making Amount and/or Complexity of Data Reviewed Radiology: ordered.  Risk Prescription drug management.          Final Clinical Impression(s) / ED Diagnoses Final diagnoses:  Closed displaced fracture of acromial end of right clavicle, initial encounter    Rx / DC Orders ED Discharge Orders          Ordered    oxyCODONE -acetaminophen  (PERCOCET/ROXICET) 5-325 MG tablet  Every 6 hours PRN        02/13/24 1556              Alexya Mcdaris F, PA-C 02/13/24 2045    Lind Repine, MD 02/16/24 1250

## 2024-02-13 NOTE — Discharge Instructions (Addendum)
 It was a pleasure taking care of you today.  Based on your history, physical exam, and imaging today you have sustained a lateral right clavicle fracture.  Your fracture is mildly displaced, comminuted, and angled.  Today you were given pain medication as well as placed in a sling for immobilization.  Attached is the information to follow-up with orthopedics, please call as soon as possible to make an appointment.  Preferably your follow-up appointment should be within 48 hours.  You have also been given outpatient pain medication, please take as prescribed.  Return to the emergency department or seek further medical care if you are experiencing the following symptoms included but not limited to chest pain, shortness of breath, severe pain, dizziness, unexplained weakness. Please continue supportive care at home including sling use and ice.

## 2024-02-17 ENCOUNTER — Ambulatory Visit: Admitting: Podiatry

## 2024-02-17 DIAGNOSIS — S42031A Displaced fracture of lateral end of right clavicle, initial encounter for closed fracture: Secondary | ICD-10-CM | POA: Diagnosis not present

## 2024-02-22 ENCOUNTER — Ambulatory Visit: Admitting: Podiatry

## 2024-02-24 DIAGNOSIS — S42031A Displaced fracture of lateral end of right clavicle, initial encounter for closed fracture: Secondary | ICD-10-CM | POA: Diagnosis not present

## 2024-02-25 ENCOUNTER — Ambulatory Visit: Admitting: Podiatry

## 2024-03-03 ENCOUNTER — Encounter: Payer: Self-pay | Admitting: Podiatry

## 2024-03-03 ENCOUNTER — Ambulatory Visit: Admitting: Podiatry

## 2024-03-03 DIAGNOSIS — M722 Plantar fascial fibromatosis: Secondary | ICD-10-CM | POA: Diagnosis not present

## 2024-03-04 NOTE — Progress Notes (Signed)
 Subjective:   Patient ID: Cynthia Gordon, female   DOB: 63 y.o.   MRN: [id number]   HPI Patient states that she does have some improvement still having discomfort upon deep palpation.   ROS      Objective:  Physical Exam  Neurovascular status intact moderate discomfort into the plantar fascial insertion right at the calcaneal fascial insertion     Assessment:  Plantar fasciitis right moderate improvement still with current     Plan:  H&P reviewed we discussed physical therapy we discussed the continuation of anti-inflammatories, to have her go to 1 a day I discussed shoe gear modifications reviewing which shoes I think would be best and we reviewed future and the type of activities that I think she can do it not to.  All questions are answered to patient and she will be seen back to recheck again as needed and may require more aggressive treatments that she understands

## 2024-03-16 DIAGNOSIS — S42031D Displaced fracture of lateral end of right clavicle, subsequent encounter for fracture with routine healing: Secondary | ICD-10-CM | POA: Diagnosis not present

## 2024-04-04 DIAGNOSIS — L821 Other seborrheic keratosis: Secondary | ICD-10-CM | POA: Diagnosis not present

## 2024-04-04 DIAGNOSIS — L309 Dermatitis, unspecified: Secondary | ICD-10-CM | POA: Diagnosis not present

## 2024-04-13 ENCOUNTER — Telehealth: Payer: Self-pay | Admitting: Podiatry

## 2024-04-13 DIAGNOSIS — S42031D Displaced fracture of lateral end of right clavicle, subsequent encounter for fracture with routine healing: Secondary | ICD-10-CM | POA: Diagnosis not present

## 2024-04-13 NOTE — Telephone Encounter (Signed)
 Called patient at 1:35 to confirm appointment cancellation for 8/7. Left voice mail for her to call us  back to confirm. If no response by end of today (04/13/2024) will cancel patient's appointment per automated system request.

## 2024-04-13 NOTE — Telephone Encounter (Signed)
 Called patient trying to confirm again that she wanted to cancel her appointment. Patient did not answer. Left voicemail informing her I was cancelling the appointment and to call if she had any questions or concerns.

## 2024-04-14 ENCOUNTER — Ambulatory Visit: Admitting: Podiatry

## 2024-04-21 DIAGNOSIS — Z01419 Encounter for gynecological examination (general) (routine) without abnormal findings: Secondary | ICD-10-CM | POA: Diagnosis not present

## 2024-04-21 DIAGNOSIS — Z6826 Body mass index (BMI) 26.0-26.9, adult: Secondary | ICD-10-CM | POA: Diagnosis not present

## 2024-04-25 DIAGNOSIS — L300 Nummular dermatitis: Secondary | ICD-10-CM | POA: Diagnosis not present

## 2024-04-25 DIAGNOSIS — L738 Other specified follicular disorders: Secondary | ICD-10-CM | POA: Diagnosis not present

## 2024-04-25 DIAGNOSIS — L218 Other seborrheic dermatitis: Secondary | ICD-10-CM | POA: Diagnosis not present

## 2024-05-27 DIAGNOSIS — Z0000 Encounter for general adult medical examination without abnormal findings: Secondary | ICD-10-CM | POA: Diagnosis not present

## 2024-05-27 DIAGNOSIS — N1831 Chronic kidney disease, stage 3a: Secondary | ICD-10-CM | POA: Diagnosis not present

## 2024-05-27 DIAGNOSIS — I10 Essential (primary) hypertension: Secondary | ICD-10-CM | POA: Diagnosis not present

## 2024-05-27 DIAGNOSIS — E559 Vitamin D deficiency, unspecified: Secondary | ICD-10-CM | POA: Diagnosis not present

## 2024-06-03 DIAGNOSIS — E559 Vitamin D deficiency, unspecified: Secondary | ICD-10-CM | POA: Diagnosis not present

## 2024-06-03 DIAGNOSIS — F418 Other specified anxiety disorders: Secondary | ICD-10-CM | POA: Diagnosis not present

## 2024-06-03 DIAGNOSIS — I10 Essential (primary) hypertension: Secondary | ICD-10-CM | POA: Diagnosis not present

## 2024-07-21 DIAGNOSIS — H25013 Cortical age-related cataract, bilateral: Secondary | ICD-10-CM | POA: Diagnosis not present

## 2024-07-21 DIAGNOSIS — H2513 Age-related nuclear cataract, bilateral: Secondary | ICD-10-CM | POA: Diagnosis not present

## 2024-07-21 DIAGNOSIS — H25043 Posterior subcapsular polar age-related cataract, bilateral: Secondary | ICD-10-CM | POA: Diagnosis not present

## 2024-07-21 DIAGNOSIS — H18413 Arcus senilis, bilateral: Secondary | ICD-10-CM | POA: Diagnosis not present

## 2024-07-21 DIAGNOSIS — H2512 Age-related nuclear cataract, left eye: Secondary | ICD-10-CM | POA: Diagnosis not present

## 2024-08-01 DIAGNOSIS — H2512 Age-related nuclear cataract, left eye: Secondary | ICD-10-CM | POA: Diagnosis not present

## 2024-08-01 DIAGNOSIS — H5371 Glare sensitivity: Secondary | ICD-10-CM | POA: Diagnosis not present

## 2024-08-01 DIAGNOSIS — H25812 Combined forms of age-related cataract, left eye: Secondary | ICD-10-CM | POA: Diagnosis not present

## 2024-08-02 DIAGNOSIS — H2511 Age-related nuclear cataract, right eye: Secondary | ICD-10-CM | POA: Diagnosis not present

## 2024-08-15 DIAGNOSIS — H52201 Unspecified astigmatism, right eye: Secondary | ICD-10-CM | POA: Diagnosis not present

## 2024-08-15 DIAGNOSIS — H25811 Combined forms of age-related cataract, right eye: Secondary | ICD-10-CM | POA: Diagnosis not present

## 2024-08-15 DIAGNOSIS — H2511 Age-related nuclear cataract, right eye: Secondary | ICD-10-CM | POA: Diagnosis not present

## 2024-08-15 DIAGNOSIS — H25011 Cortical age-related cataract, right eye: Secondary | ICD-10-CM | POA: Diagnosis not present

## 2024-08-15 DIAGNOSIS — H5371 Glare sensitivity: Secondary | ICD-10-CM | POA: Diagnosis not present

## 2024-08-31 DIAGNOSIS — S42031D Displaced fracture of lateral end of right clavicle, subsequent encounter for fracture with routine healing: Secondary | ICD-10-CM | POA: Diagnosis not present
